# Patient Record
Sex: Female | Born: 1935 | Race: White | Hispanic: No | Smoking: Former smoker
Health system: Southern US, Community
[De-identification: ages and names within clinical notes are randomized; demographics above are authoritative.]

## PROBLEM LIST (undated history)

## (undated) DIAGNOSIS — M8580 Other specified disorders of bone density and structure, unspecified site: Secondary | ICD-10-CM

## (undated) DIAGNOSIS — D649 Anemia, unspecified: Secondary | ICD-10-CM

## (undated) DIAGNOSIS — G5602 Carpal tunnel syndrome, left upper limb: Secondary | ICD-10-CM

## (undated) DIAGNOSIS — C801 Malignant (primary) neoplasm, unspecified: Secondary | ICD-10-CM

## (undated) DIAGNOSIS — I10 Essential (primary) hypertension: Secondary | ICD-10-CM

## (undated) DIAGNOSIS — M1990 Unspecified osteoarthritis, unspecified site: Secondary | ICD-10-CM

## (undated) HISTORY — PX: CARPAL TUNNEL RELEASE: SHX101

## (undated) HISTORY — PX: VAGINAL HYSTERECTOMY: SUR661

## (undated) HISTORY — PX: CATARACT EXTRACTION W/ INTRAOCULAR LENS  IMPLANT, BILATERAL: SHX1307

## (undated) HISTORY — PX: KNEE SURGERY: SHX244

## (undated) HISTORY — PX: RETINAL DETACHMENT SURGERY: SHX105

---

## 1965-09-20 HISTORY — PX: THYROIDECTOMY: SHX17

## 1998-07-17 ENCOUNTER — Ambulatory Visit (HOSPITAL_COMMUNITY): Admission: RE | Admit: 1998-07-17 | Discharge: 1998-07-17 | Payer: Self-pay | Admitting: Obstetrics & Gynecology

## 2003-06-06 ENCOUNTER — Encounter (INDEPENDENT_AMBULATORY_CARE_PROVIDER_SITE_OTHER): Payer: Self-pay | Admitting: Specialist

## 2003-06-06 ENCOUNTER — Ambulatory Visit (HOSPITAL_COMMUNITY): Admission: RE | Admit: 2003-06-06 | Discharge: 2003-06-06 | Payer: Self-pay | Admitting: Gastroenterology

## 2007-09-26 ENCOUNTER — Encounter (HOSPITAL_COMMUNITY): Payer: Self-pay | Admitting: Obstetrics and Gynecology

## 2007-09-26 ENCOUNTER — Ambulatory Visit (HOSPITAL_COMMUNITY): Admission: RE | Admit: 2007-09-26 | Discharge: 2007-09-26 | Payer: Self-pay | Admitting: Obstetrics and Gynecology

## 2008-07-11 ENCOUNTER — Ambulatory Visit (HOSPITAL_BASED_OUTPATIENT_CLINIC_OR_DEPARTMENT_OTHER): Admission: RE | Admit: 2008-07-11 | Discharge: 2008-07-11 | Payer: Self-pay | Admitting: Orthopedic Surgery

## 2011-02-02 NOTE — Op Note (Signed)
Colleen Mclaughlin, Colleen Mclaughlin               ACCOUNT NO.:  [id number]   MEDICAL RECORD NO.:  [id number]          PATIENT TYPE:  AMB   LOCATION:  DFTL                          FACILITY:  [facility]   PHYSICIAN:  Zelphia Cairo, MD    DATE OF BIRTH:  06-08-1933   DATE OF PROCEDURE:  09/26/2007  DATE OF DISCHARGE:                               OPERATIVE REPORT   PREOPERATIVE DIAGNOSIS:  Postmenopausal bleeding.   POSTOPERATIVE DIAGNOSIS:  Postmenopausal bleeding, path pending.   PROCEDURE:  Hysteroscopy D&C.   SURGEON:  Zelphia Cairo, MD.   ANESTHESIA:  General.   COMPLICATIONS:  None.   CONDITION:  Stable and extubated to recovery room.   SPECIMEN:  Endometrial curettings to pathology.   PROCEDURE:  The patient was taken to the operating room where anesthesia  was found to be adequate.  She was placed in the dorsal lithotomy  position using Allen stirrups.  She was prepped and draped in sterile  fashion and a catheter was used to drain her bladder sterilely.  Bivalve  speculum was placed in the vagina and a single-tooth tenaculum on the  anterior lip of the cervix and the cervix was then serially dilated and  the diagnostic hysteroscope was inserted.  The endometrial cavity was  observed.  Due to pressure settings on our scope, it was difficult to  get adequate uterine distention, however, the uterine cavity that was  visualized appeared atrophic without mass lesions.  The hysteroscope was  then removed and a curette was used to perform a gentle curetting.   Specimen was passed off and sent to pathology.  The patient tolerated  the procedure well.  Sponge, lap, needle and instrument counts were  correct x2.      Zelphia Cairo, MD  Electronically Signed     GA/MEDQ  D:  09/27/2007  T:  09/27/2007  Job:  [id number]

## 2011-02-02 NOTE — Op Note (Signed)
NAMESURI, TAFOLLA               ACCOUNT NO.:  [id number]   MEDICAL RECORD NO.:  [id number]          PATIENT TYPE:  AMB   LOCATION:  DSC                          FACILITY:  [facility]   PHYSICIAN:  Katy Fitch. Sypher, M.D. DATE OF BIRTH:  1932-12-06   DATE OF PROCEDURE:  07/11/2008  DATE OF DISCHARGE:                               OPERATIVE REPORT   PREOPERATIVE DIAGNOSIS:  Severe entrapped neuropathy, right median nerve  at carpal tunnel with background diabetes.   POSTOPERATIVE DIAGNOSIS:  Severe entrapped neuropathy, right median  nerve at carpal tunnel with background diabetes.   OPERATIONS:  Release of right transcarpal ligament.   OPERATIONS:  Katy Fitch. Sypher, MD   ASSISTANT:  Marveen Reeks Dasnoit, PA   ANESTHESIA:  General by LMA.   SUPERVISING ANESTHESIOLOGIST:  Janetta Hora. Gelene Mink, MD   INDICATIONS:  Colleen Mclaughlin is a 77 year old woman who referred  through the courtesy of Dr. Dossie Arbour of [facility]  for evaluation of bilateral hand pain and numbness.  She is also  referred by her daughter who is a patient with our practice who has had  bilateral carpal tunnel syndrome.   Ms. Sappington was noted on clinical examination to have mild thenar atrophy,  numbness in the median distribution, and electrodiagnostic studies  revealing severe bilateral carpal tunnel syndrome, left slightly worsen  than right.  We recommended proceeding with release of her right  transcarpal ligament at this time.  She was advised preoperatively that  it would take between 4 and 5 months to see the full benefit of surgery.   At age 77 with diabetes, we cannot guarantee a full recovery of her  median nerve function.   After informed consent, she is brought to the operating room at this  time.   PROCEDURE:  Avilene Marrin was brought to the operating room and  placed in a supine position on the operating table.   Following the induction of general anesthesia by LMA  technique, the  right arm was prepped with Betadine soap solution and sterilely draped.  A pneumatic tourniquet was applied to the proximal right brachium.   Following exsanguination of the right arm with an Esmarch bandage, an  arterial tourniquet was inflated to 220 mmHg.  The procedure commenced  with a short incision in the line of the ring finger in the palm.  Subcutaneous tissues were carefully divided revealing the palmar fascia.   This was split longitudinally to reveal a common sensory branch of the  median nerve and superficial palmar arch.   The common sensory branches were followed back to the median nerve  proper, which was gently isolated from the transcarpal ligament deep  surface.  Ligament was then released along its ulnar border extending  into the distal forearm.   This widely opened carpal canal.   No mass or other predicaments were noted.   Bleeding points along the margin of the released ligament were  electrocauterized with bipolar current followed by repair of the skin  with intradermal 2-0 Prolene suture.   A compressive dressing was applied with a  volar plaster splint  maintaining the wrist in 5 degrees of dorsiflexion.  For aftercare, Ms.  Guedes was provided a prescription for Vicodin 5 mg 1 p.o. q.4-6 h.  p.r.n. pain 20 tablets without refill.      Katy Fitch Sypher, M.D.  Electronically Signed     RVS/MEDQ  D:  07/11/2008  T:  07/11/2008  Job:  [id number]   cc:   Barry Dienes. Eloise Harman, M.D.

## 2011-02-05 NOTE — Op Note (Signed)
   NAME:  Colleen Mclaughlin, Colleen Mclaughlin                         ACCOUNT NO.:  [id number]   MEDICAL RECORD NO.:  [id number]                   PATIENT TYPE:  AMB   LOCATION:  ENDO                                 FACILITY:  [facility]   PHYSICIAN:  John C. Madilyn Fireman, M.D.                 DATE OF BIRTH:  [birth year]   DATE OF PROCEDURE:  06/06/2003  DATE OF DISCHARGE:                                 OPERATIVE REPORT   PROCEDURE:  Colonoscopy.   INDICATION FOR PROCEDURE:  Colon cancer screening in a 77 year old patient  with no previous screening.   DESCRIPTION OF PROCEDURE:  The patient was placed in the left lateral  decubitus position and placed on the pulse monitor with continuous low-flow  oxygen delivered by nasal cannula.  She was sedated with 100 mcg IV fentanyl  and 8 mg IV Versed.  The Olympus video colonoscope was inserted into the  rectum and advanced to the cecum, confirmed by transillumination at  McBurney's point and visualization of the ileocecal valve and appendiceal  orifice.  The prep was excellent.  The cecum and ascending colon appeared  normal with no masses, polyps, diverticula, or other mucosal abnormalities.  Within the transverse colon, there was seen a 1.2 cm pedunculated polyp  which was removed by snare.  The remainder of the transverse, descending,  sigmoid, and rectum appeared normal with no further polyps, masses,  diverticula, or other mucosal abnormalities.  The scope was then withdrawn  and the patient returned to the recovery room in stable condition.  She  tolerated the procedure well, and there were no immediate complications.   IMPRESSION:  1. Transverse colon polyp.  2. Otherwise, normal study.   PLAN:  Await histology to determine method and interval for future colon  screening.                                               John C. Madilyn Fireman, M.D.    JCH/MEDQ  D:  06/06/2003  T:  06/06/2003  Job:  [id number]   cc:   Barry Dienes. Eloise Harman, M.D.  [address]  [place]  [place] [address]  Fax: [phone]

## 2011-06-10 LAB — CBC
Hemoglobin: 16.3 — ABNORMAL HIGH
MCHC: 34.7
RBC: 4.92

## 2011-06-10 LAB — BASIC METABOLIC PANEL WITH GFR
BUN: 18
CO2: 23
Calcium: 8.9
Chloride: 102
Creatinine, Ser: 0.88
GFR calc non Af Amer: >60
Glucose, Bld: 81
Potassium: 3.6
Sodium: 136

## 2011-06-10 LAB — TYPE AND SCREEN
ABO/RH(D): O POS
Antibody Screen: NEGATIVE

## 2011-06-10 LAB — ABO/RH: ABO/RH(D): O POS

## 2011-06-22 LAB — BASIC METABOLIC PANEL
CO2: 25
Chloride: 108
GFR calc Af Amer: >60
Potassium: 4.1
Sodium: 141

## 2011-07-30 ENCOUNTER — Other Ambulatory Visit: Payer: Self-pay | Admitting: Neurology

## 2011-07-30 DIAGNOSIS — M79609 Pain in unspecified limb: Secondary | ICD-10-CM

## 2011-07-30 DIAGNOSIS — R269 Unspecified abnormalities of gait and mobility: Secondary | ICD-10-CM

## 2011-08-09 ENCOUNTER — Ambulatory Visit
Admission: RE | Admit: 2011-08-09 | Discharge: 2011-08-09 | Disposition: A | Discharge status: Home / Self Care | Payer: Medicare Other | Source: Ambulatory Visit | Attending: Neurology | Admitting: Neurology

## 2011-08-09 DIAGNOSIS — R269 Unspecified abnormalities of gait and mobility: Secondary | ICD-10-CM

## 2011-08-09 DIAGNOSIS — M79609 Pain in unspecified limb: Secondary | ICD-10-CM

## 2014-06-26 ENCOUNTER — Other Ambulatory Visit: Payer: Self-pay | Admitting: Obstetrics and Gynecology

## 2014-06-27 LAB — CYTOLOGY - PAP

## 2015-08-01 ENCOUNTER — Ambulatory Visit (HOSPITAL_COMMUNITY): Payer: Medicare Other

## 2015-08-01 ENCOUNTER — Other Ambulatory Visit (HOSPITAL_COMMUNITY): Payer: Self-pay | Admitting: Orthopaedic Surgery

## 2015-08-01 DIAGNOSIS — R52 Pain, unspecified: Secondary | ICD-10-CM

## 2016-06-22 ENCOUNTER — Ambulatory Visit (INDEPENDENT_AMBULATORY_CARE_PROVIDER_SITE_OTHER): Payer: Medicare Other | Admitting: Orthopaedic Surgery

## 2016-06-22 ENCOUNTER — Encounter (INDEPENDENT_AMBULATORY_CARE_PROVIDER_SITE_OTHER): Payer: Self-pay

## 2016-06-22 DIAGNOSIS — M542 Cervicalgia: Secondary | ICD-10-CM

## 2016-06-22 DIAGNOSIS — M1711 Unilateral primary osteoarthritis, right knee: Secondary | ICD-10-CM | POA: Diagnosis not present

## 2018-01-19 ENCOUNTER — Other Ambulatory Visit: Payer: Self-pay | Admitting: Internal Medicine

## 2018-01-19 DIAGNOSIS — R221 Localized swelling, mass and lump, neck: Secondary | ICD-10-CM

## 2018-01-20 ENCOUNTER — Ambulatory Visit
Admission: RE | Admit: 2018-01-20 | Discharge: 2018-01-20 | Disposition: A | Discharge status: Home / Self Care | Payer: Medicare Other | Source: Ambulatory Visit | Attending: Internal Medicine | Admitting: Internal Medicine

## 2018-01-20 DIAGNOSIS — R221 Localized swelling, mass and lump, neck: Secondary | ICD-10-CM

## 2018-01-20 MED ORDER — IOPAMIDOL (ISOVUE-300) INJECTION 61%
75.0000 mL | Freq: Once | INTRAVENOUS | Status: AC | PRN
  Administered 2018-01-20: 75 mL via INTRAVENOUS

## 2018-07-20 ENCOUNTER — Other Ambulatory Visit: Payer: Self-pay | Admitting: Orthopedic Surgery

## 2018-08-21 ENCOUNTER — Other Ambulatory Visit: Payer: Self-pay

## 2018-08-21 ENCOUNTER — Encounter (HOSPITAL_BASED_OUTPATIENT_CLINIC_OR_DEPARTMENT_OTHER): Payer: Self-pay

## 2018-08-22 ENCOUNTER — Encounter (HOSPITAL_BASED_OUTPATIENT_CLINIC_OR_DEPARTMENT_OTHER)
Admission: RE | Admit: 2018-08-22 | Discharge: 2018-08-22 | Disposition: A | Discharge status: Home / Self Care | Payer: Medicare Other | Source: Ambulatory Visit | Attending: Orthopedic Surgery | Admitting: Orthopedic Surgery

## 2018-08-22 DIAGNOSIS — I10 Essential (primary) hypertension: Secondary | ICD-10-CM | POA: Diagnosis present

## 2018-08-22 DIAGNOSIS — I499 Cardiac arrhythmia, unspecified: Secondary | ICD-10-CM | POA: Diagnosis not present

## 2018-08-22 DIAGNOSIS — I452 Bifascicular block: Secondary | ICD-10-CM | POA: Diagnosis not present

## 2018-08-22 NOTE — Progress Notes (Signed)
EKG reviewed by Dr. Miller, will proceed with surgery as scheduled.  

## 2018-08-29 ENCOUNTER — Encounter (HOSPITAL_BASED_OUTPATIENT_CLINIC_OR_DEPARTMENT_OTHER): Payer: Self-pay

## 2018-08-29 ENCOUNTER — Other Ambulatory Visit: Payer: Self-pay

## 2018-08-29 ENCOUNTER — Ambulatory Visit (HOSPITAL_BASED_OUTPATIENT_CLINIC_OR_DEPARTMENT_OTHER): Payer: Medicare Other | Admitting: Anesthesiology

## 2018-08-29 ENCOUNTER — Ambulatory Visit (HOSPITAL_BASED_OUTPATIENT_CLINIC_OR_DEPARTMENT_OTHER)
Admission: RE | Admit: 2018-08-29 | Discharge: 2018-08-29 | Disposition: A | Discharge status: Home / Self Care | Payer: Medicare Other | Source: Ambulatory Visit | Attending: Orthopedic Surgery | Admitting: Orthopedic Surgery

## 2018-08-29 ENCOUNTER — Encounter (HOSPITAL_BASED_OUTPATIENT_CLINIC_OR_DEPARTMENT_OTHER)
Admission: RE | Disposition: A | Discharge status: Home / Self Care | Payer: Self-pay | Source: Ambulatory Visit | Attending: Orthopedic Surgery

## 2018-08-29 DIAGNOSIS — G5602 Carpal tunnel syndrome, left upper limb: Secondary | ICD-10-CM | POA: Insufficient documentation

## 2018-08-29 DIAGNOSIS — Z87891 Personal history of nicotine dependence: Secondary | ICD-10-CM | POA: Diagnosis not present

## 2018-08-29 DIAGNOSIS — E890 Postprocedural hypothyroidism: Secondary | ICD-10-CM | POA: Diagnosis not present

## 2018-08-29 DIAGNOSIS — I10 Essential (primary) hypertension: Secondary | ICD-10-CM | POA: Diagnosis not present

## 2018-08-29 HISTORY — DX: Anemia, unspecified: D64.9

## 2018-08-29 HISTORY — DX: Other specified disorders of bone density and structure, unspecified site: M85.80

## 2018-08-29 HISTORY — DX: Essential (primary) hypertension: I10

## 2018-08-29 HISTORY — DX: Unspecified osteoarthritis, unspecified site: M19.90

## 2018-08-29 HISTORY — DX: Malignant (primary) neoplasm, unspecified: C80.1

## 2018-08-29 HISTORY — PX: CARPAL TUNNEL RELEASE: SHX101

## 2018-08-29 HISTORY — DX: Carpal tunnel syndrome, left upper limb: G56.02

## 2018-08-29 SURGERY — CARPAL TUNNEL RELEASE
Anesthesia: Monitor Anesthesia Care | Site: Wrist | Laterality: Left

## 2018-08-29 MED ORDER — MIDAZOLAM HCL 2 MG/2ML IJ SOLN: 1.0000 mg | INTRAMUSCULAR | Status: DC | PRN

## 2018-08-29 MED ORDER — SCOPOLAMINE 1 MG/3DAYS TD PT72: 1.0000 | MEDICATED_PATCH | Freq: Once | TRANSDERMAL | Status: DC | PRN

## 2018-08-29 MED ORDER — FENTANYL CITRATE (PF) 100 MCG/2ML IJ SOLN
INTRAMUSCULAR | Status: AC
  Filled 2018-08-29: qty 2

## 2018-08-29 MED ORDER — TRAMADOL HCL 50 MG PO TABS: 50.0000 mg | ORAL_TABLET | Freq: Four times a day (QID) | ORAL | 0 refills | Status: DC | PRN

## 2018-08-29 MED ORDER — BUPIVACAINE HCL (PF) 0.25 % IJ SOLN
INTRAMUSCULAR | Status: DC | PRN
  Administered 2018-08-29: 9 mL

## 2018-08-29 MED ORDER — FENTANYL CITRATE (PF) 100 MCG/2ML IJ SOLN
50.0000 ug | INTRAMUSCULAR | Status: DC | PRN
  Administered 2018-08-29: 50 ug via INTRAVENOUS

## 2018-08-29 MED ORDER — CHLORHEXIDINE GLUCONATE 4 % EX LIQD: 60.0000 mL | Freq: Once | CUTANEOUS | Status: DC

## 2018-08-29 MED ORDER — PROPOFOL 500 MG/50ML IV EMUL
INTRAVENOUS | Status: DC | PRN
  Administered 2018-08-29: 50 ug/kg/min via INTRAVENOUS

## 2018-08-29 MED ORDER — LIDOCAINE HCL (PF) 0.5 % IJ SOLN
INTRAMUSCULAR | Status: DC | PRN
  Administered 2018-08-29: 30 mL via INTRAVENOUS

## 2018-08-29 MED ORDER — CEFAZOLIN SODIUM-DEXTROSE 2-4 GM/100ML-% IV SOLN
2.0000 g | INTRAVENOUS | Status: AC
  Administered 2018-08-29: 2 g via INTRAVENOUS

## 2018-08-29 MED ORDER — CEFAZOLIN SODIUM-DEXTROSE 2-4 GM/100ML-% IV SOLN
INTRAVENOUS | Status: AC
  Filled 2018-08-29: qty 100

## 2018-08-29 MED ORDER — FENTANYL CITRATE (PF) 100 MCG/2ML IJ SOLN: 25.0000 ug | INTRAMUSCULAR | Status: DC | PRN

## 2018-08-29 MED ORDER — LACTATED RINGERS IV SOLN
INTRAVENOUS | Status: DC
  Administered 2018-08-29: 11:00:00 via INTRAVENOUS

## 2018-08-29 SURGICAL SUPPLY — 37 items
BLADE SURG 15 STRL LF DISP TIS (BLADE) ×1 IMPLANT
BLADE SURG 15 STRL SS (BLADE) ×2
BNDG COHESIVE 3X5 TAN STRL LF (GAUZE/BANDAGES/DRESSINGS) ×3 IMPLANT
BNDG ESMARK 4X9 LF (GAUZE/BANDAGES/DRESSINGS) IMPLANT
BNDG GAUZE ELAST 4 BULKY (GAUZE/BANDAGES/DRESSINGS) ×3 IMPLANT
CHLORAPREP W/TINT 26ML (MISCELLANEOUS) ×3 IMPLANT
CORD BIPOLAR FORCEPS 12FT (ELECTRODE) ×3 IMPLANT
COVER BACK TABLE 60X90IN (DRAPES) ×3 IMPLANT
COVER MAYO STAND STRL (DRAPES) ×3 IMPLANT
COVER WAND RF STERILE (DRAPES) IMPLANT
CUFF TOURNIQUET SINGLE 18IN (TOURNIQUET CUFF) ×3 IMPLANT
DRAPE EXTREMITY T 121X128X90 (DRAPE) ×3 IMPLANT
DRAPE SURG 17X23 STRL (DRAPES) ×3 IMPLANT
DRSG PAD ABDOMINAL 8X10 ST (GAUZE/BANDAGES/DRESSINGS) ×3 IMPLANT
GAUZE SPONGE 4X4 12PLY STRL (GAUZE/BANDAGES/DRESSINGS) ×3 IMPLANT
GAUZE XEROFORM 1X8 LF (GAUZE/BANDAGES/DRESSINGS) ×3 IMPLANT
GLOVE BIOGEL PI IND STRL 7.0 (GLOVE) ×1 IMPLANT
GLOVE BIOGEL PI IND STRL 8 (GLOVE) ×1 IMPLANT
GLOVE BIOGEL PI IND STRL 8.5 (GLOVE) ×1 IMPLANT
GLOVE BIOGEL PI INDICATOR 7.0 (GLOVE) ×2
GLOVE BIOGEL PI INDICATOR 8 (GLOVE) ×2
GLOVE BIOGEL PI INDICATOR 8.5 (GLOVE) ×2
GLOVE SURG ORTHO 8.0 STRL STRW (GLOVE) ×3 IMPLANT
GLOVE SURG SYN 8.0 (GLOVE) ×3 IMPLANT
GOWN STRL REUS W/ TWL LRG LVL3 (GOWN DISPOSABLE) ×1 IMPLANT
GOWN STRL REUS W/TWL LRG LVL3 (GOWN DISPOSABLE) ×2
GOWN STRL REUS W/TWL XL LVL3 (GOWN DISPOSABLE) ×3 IMPLANT
NEEDLE PRECISIONGLIDE 27X1.5 (NEEDLE) IMPLANT
NS IRRIG 1000ML POUR BTL (IV SOLUTION) ×3 IMPLANT
PACK BASIN DAY SURGERY FS (CUSTOM PROCEDURE TRAY) ×3 IMPLANT
STOCKINETTE 4X48 STRL (DRAPES) ×3 IMPLANT
SUT ETHILON 4 0 PS 2 18 (SUTURE) ×3 IMPLANT
SUT VICRYL 4-0 PS2 18IN ABS (SUTURE) IMPLANT
SYR BULB 3OZ (MISCELLANEOUS) ×3 IMPLANT
SYR CONTROL 10ML LL (SYRINGE) IMPLANT
TOWEL GREEN STERILE FF (TOWEL DISPOSABLE) ×3 IMPLANT
UNDERPAD 30X30 (UNDERPADS AND DIAPERS) ×3 IMPLANT

## 2018-08-29 NOTE — Discharge Instructions (Addendum)

## 2018-08-29 NOTE — Transfer of Care (Signed)
Immediate Anesthesia Transfer of Care Note  Patient: Colleen Mclaughlin  Procedure(s) Performed: LEFT CARPAL TUNNEL RELEASE (Left Wrist)  Patient Location: PACU  Anesthesia Type:MAC and Bier block  Level of Consciousness: awake, alert  and oriented  Airway & Oxygen Therapy: Patient Spontanous Breathing  Post-op Assessment: Report given to RN and Post -op Vital signs reviewed and stable  Post vital signs: Reviewed and stable  Last Vitals:  Vitals Value Taken Time  BP    Temp    Pulse 49 08/29/2018 11:13 AM  Resp 14 08/29/2018 11:13 AM  SpO2 97 % 08/29/2018 11:13 AM  Vitals shown include unvalidated device data.  Last Pain:  Vitals:   08/29/18 1023  TempSrc: Oral  PainSc: 0-No pain         Complications: No apparent anesthesia complications

## 2018-08-29 NOTE — Op Note (Signed)
NAME: Colleen Mclaughlin MEDICAL RECORD NO: [id number] DATE OF BIRTH: 30-Nov-1932 [facility] LOCATION: [facility] PHYSICIAN: Wynonia Sours, MD   OPERATIVE REPORT   DATE OF PROCEDURE: 08/29/18    PREOPERATIVE DIAGNOSIS:   Carpal tunnel syndrome left hand   POSTOPERATIVE DIAGNOSIS:   Same   PROCEDURE:   Decompression median nerve left hand   SURGEON: Daryll Brod, M.D.   ASSISTANT: none   ANESTHESIA:  Bier block with sedation and Local   INTRAVENOUS FLUIDS:  Per anesthesia flow sheet.   ESTIMATED BLOOD LOSS:  Minimal.   COMPLICATIONS:  None.   SPECIMENS:  none   TOURNIQUET TIME:    Total Tourniquet Time Documented: Forearm (Left) - 16 minutes Total: Forearm (Left) - 16 minutes    DISPOSITION:  Stable to PACU.   INDICATIONS: Patient is an 84 year old female with a history of numbness and tingling of her hands.  She has had nerve conductions performed revealing no response to the median nerve sensory component.  She is desirous of proceeding to have this released in an effort to regain some sensibility.  Pre-peri-and postoperative course been discussed along with risk complications.  She is aware that there is no guarantee to the surgery the possibility of infection recurrence injury to arteries nerves tendons complete relief of symptoms distally possibility that this is only to halt the process and prevent it from getting worse.  Preoperative area the patient is seen the extremity marked by both patient and surgeon antibiotic given  OPERATIVE COURSE: Patient is brought to the operating room where a forearm-based IV regional anesthetic was carried out without difficulty.  This was after being placed in a supine position.  A prep was done with ChloraPrep and a three-minute dry time was allowed.  Was taken to confirm patient procedure.  A longitudinal incision was made left palm carried down through subcutaneous tissue.  Bleeders were electrocauterized with  bipolar.  The palmar fascia was split.  The superficial palmar arch was identified along with the flexor tendon the ring little finger.  Retractors were placed retracting median nerve radially ulnar nerve ulnarly the flexor retinaculum was then incised with sharp dissection and its ulnar border.  A right angle and saw retractor placed tween skin and forearm fascia the fascia was then released for approximately 3 2 to 3 cm proximal to the wrist crease under direct vision.  Persistent median artery was present.  The area compression to the nerve was apparent.  Motor branch entered the muscle distally.  Wound was copiously irrigated with saline.  Skin was closed interrupted 4-0 nylon sutures.  Local infiltration quarter percent bupivacaine without epinephrine was given approximately 8 cc was used.  Sterile compressive dressing with the fingers free was applied.  On flyer deflation of the tourniquet all fingers immediately pink.  She was taken to the recovery room for observation in satisfactory condition.  She will be discharged home to return the hand center of [facility] to take Tylenol with ibuprofen and has tramadol for breakthrough.   Daryll Brod, MD Electronically signed, 08/29/18

## 2018-08-29 NOTE — H&P (Signed)
Colleen Mclaughlin is an 84 y.o. female.   Chief Complaint: numbness left hand [name] is an 84 year old right-hand-dominant female referred by Dr. Sharlett Iles for consultation regarding numbness tingling pain in her left hand. This thumb through middle fingers. This been going on for years. She thought she might have a carpal tunnel release a year ago but her husband died. She has had a carpal tunnel release done in the past by Dr. Harley Hallmark many years ago. She had nerve conductions prior to that time. She states she is not dropping things but has a sharp pain with a VAS score 10/10. She has no history of injury to the hand or to the neck. She has not had any treatment nor tried anything for this. She is not awakened at night. She has a history of thyroid problems arthritis no history of diabetes or gout. Family  history is positive diabetes negative for thyroid problems arthritis  She has had nerve conductions done revealing a motor delay of 6.8 on her left side with no sensory response.   Past Medical History:  Diagnosis Date  . Anemia    x2 years ago took med cleared  . Arthritis    general  . Cancer ([place])    endometrial / complete hyst 2009  . Carpal tunnel syndrome on left   . Hypertension   . Osteopenia     Past Surgical History:  Procedure Laterality Date  . CARPAL TUNNEL RELEASE Right    2012  . CATARACT EXTRACTION W/ INTRAOCULAR LENS  IMPLANT, BILATERAL     one approx 5 years ago and the other greater than that but unsure when  . KNEE SURGERY Right    approx 25 years ago "chipped bone"  . RETINAL DETACHMENT SURGERY    . THYROIDECTOMY  1967  . VAGINAL HYSTERECTOMY     2009    History reviewed. No pertinent family history. Social History:  reports that she quit smoking about 23 years ago. She has never used smokeless tobacco. She reports that she does not drink alcohol or use drugs.  Allergies:  Allergies  Allergen Reactions  . Sulfa Antibiotics Anaphylaxis    Hives    . Codeine Other (See Comments)    Severe headaches     No medications prior to admission.    No results found for this or any previous visit (from the past 48 hour(s)).  No results found.   Pertinent items are noted in HPI.  Height 5\' 4"  (1.626 m).  General appearance: alert, cooperative and appears stated age Head: Normocephalic, without obvious abnormality Neck: no JVD Resp: clear to auscultation bilaterally Cardio: regular rate and rhythm, S1, S2 normal, no murmur, click, rub or gallop GI: soft, non-tender; bowel sounds normal; no masses,  no organomegaly Extremities: numbness left hand Pulses: 2+ and symmetric Skin: Skin color, texture, turgor normal. No rashes or lesions Neurologic: Grossly normal Incision/Wound: na  Assessment/Plan Assessment:  1. Carpal tunnel syndrome of left wrist    Plan: Have discussed the nerve conductions with her. She would like to go ahead and have this surgically released. Pre-peri-postoperative course are discussed along with risk complications. She is aware there is no guarantee to the surgery the possibility of infection recurrence injury to arteries nerves tendons complete relief symptoms dystrophy. I do not feel she needs a campus transfer at this time and that she is able to oppose her thumb despite the atrophy of the thenar intrinsics. They are aware that we are attempting to  halt the process to get the nerve a chance to get better but we cannot guarantee that she is scheduled for left carpal tunnel release in outpatient under regional anesthesia   Daryll Brod 08/29/2018, 9:01 AM

## 2018-08-29 NOTE — Brief Op Note (Signed)
08/29/2018  11:06 AM  PATIENT:  Colleen Mclaughlin  84 y.o. female  PRE-OPERATIVE DIAGNOSIS:  LEFT CARPAL TUNNEL SYNDROME  POST-OPERATIVE DIAGNOSIS:  LEFT CARPAL TUNNEL SYNDROME  PROCEDURE:  Procedure(s): LEFT CARPAL TUNNEL RELEASE (Left)  SURGEON:  Surgeon(s) and Role:    * Daryll Brod, MD - Primary  PHYSICIAN ASSISTANT:   ASSISTANTS: none   ANESTHESIA:   local, regional and IV sedation  EBL:  75ml   BLOOD ADMINISTERED:none  DRAINS: none   LOCAL MEDICATIONS USED:  BUPIVICAINE   SPECIMEN:  No Specimen  DISPOSITION OF SPECIMEN:  N/A  COUNTS:  YES  TOURNIQUET:   Total Tourniquet Time Documented: Forearm (Left) - 16 minutes Total: Forearm (Left) - 16 minutes   DICTATION: .Viviann Spare Dictation  PLAN OF CARE: Discharge to home after PACU  PATIENT DISPOSITION:  PACU - hemodynamically stable.

## 2018-08-29 NOTE — Anesthesia Preprocedure Evaluation (Signed)
Anesthesia Evaluation  Patient identified by MRN, date of birth, ID band Patient awake    Reviewed: Allergy & Precautions, NPO status , Patient's Chart, lab work & pertinent test results  Airway Mallampati: II  TM Distance: >3 FB Neck ROM: Full    Dental   Pulmonary former smoker,    breath sounds clear to auscultation       Cardiovascular hypertension, Pt. on medications and Pt. on home beta blockers  Rhythm:Regular Rate:Normal     Neuro/Psych  Neuromuscular disease    GI/Hepatic negative GI ROS, Neg liver ROS,   Endo/Other  Hypothyroidism   Renal/GU negative Renal ROS     Musculoskeletal  (+) Arthritis ,   Abdominal   Peds  Hematology negative hematology ROS (+)   Anesthesia Other Findings   Reproductive/Obstetrics                             Anesthesia Physical Anesthesia Plan  ASA: III  Anesthesia Plan: MAC and Bier Block and Bier Block-LIDOCAINE ONLY   Post-op Pain Management:    Induction: Intravenous  PONV Risk Score and Plan: 2 and Propofol infusion, Ondansetron and Treatment may vary due to age or medical condition  Airway Management Planned: Natural Airway and Simple Face Mask  Additional Equipment:   Intra-op Plan:   Post-operative Plan:   Informed Consent: I have reviewed the patients History and Physical, chart, labs and discussed the procedure including the risks, benefits and alternatives for the proposed anesthesia with the patient or authorized representative who has indicated his/her understanding and acceptance.     Plan Discussed with: CRNA  Anesthesia Plan Comments:         Anesthesia Quick Evaluation

## 2018-08-29 NOTE — Anesthesia Procedure Notes (Signed)
Anesthesia Regional Block: Bier block (IV Regional)   Pre-Anesthetic Checklist: ,, timeout performed, Correct Patient, Correct Site, Correct Laterality, Correct Procedure,, site marked, surgical consent,, at surgeon's request  Laterality: Left     Needles:  Injection technique: Single-shot  Needle Type: Other      Needle Gauge: 22     Additional Needles:   Procedures:,,,,, intact distal pulses, Esmarch exsanguination, single tourniquet utilized,  Narrative:   Performed by: [employer]

## 2018-08-29 NOTE — Anesthesia Postprocedure Evaluation (Signed)
Anesthesia Post Note  Patient: Colleen Mclaughlin  Procedure(s) Performed: LEFT CARPAL TUNNEL RELEASE (Left Wrist)     Patient location during evaluation: PACU Anesthesia Type: MAC and Bier Block Level of consciousness: awake and alert Pain management: pain level controlled Vital Signs Assessment: post-procedure vital signs reviewed and stable Respiratory status: spontaneous breathing, nonlabored ventilation, respiratory function stable and patient connected to nasal cannula oxygen Cardiovascular status: stable and blood pressure returned to baseline Postop Assessment: no apparent nausea or vomiting Anesthetic complications: no    Last Vitals:  Vitals:   08/29/18 1132 08/29/18 1141  BP: (!) 128/56   Pulse: (!) 47   Resp: 18 20  Temp: 37 C   SpO2: 97% 100%    Last Pain:  Vitals:   08/29/18 1023  TempSrc: Oral  PainSc: 0-No pain                 Tiajuana Amass

## 2018-08-30 ENCOUNTER — Encounter (HOSPITAL_BASED_OUTPATIENT_CLINIC_OR_DEPARTMENT_OTHER): Payer: Self-pay | Admitting: Orthopedic Surgery

## 2018-09-07 ENCOUNTER — Encounter (HOSPITAL_COMMUNITY): Payer: Self-pay

## 2018-09-07 ENCOUNTER — Emergency Department (HOSPITAL_COMMUNITY): Payer: Medicare Other

## 2018-09-07 ENCOUNTER — Other Ambulatory Visit: Payer: Self-pay

## 2018-09-07 ENCOUNTER — Inpatient Hospital Stay (HOSPITAL_COMMUNITY)
Admission: EM | Admit: 2018-09-07 | Discharge: 2018-09-10 | DRG: 470 | Disposition: A | Discharge status: Home Health | Payer: Medicare Other | Attending: Internal Medicine | Admitting: Internal Medicine

## 2018-09-07 DIAGNOSIS — Z7982 Long term (current) use of aspirin: Secondary | ICD-10-CM

## 2018-09-07 DIAGNOSIS — S72002A Fracture of unspecified part of neck of left femur, initial encounter for closed fracture: Principal | ICD-10-CM | POA: Diagnosis present

## 2018-09-07 DIAGNOSIS — Z8542 Personal history of malignant neoplasm of other parts of uterus: Secondary | ICD-10-CM | POA: Diagnosis not present

## 2018-09-07 DIAGNOSIS — Y92018 Other place in single-family (private) house as the place of occurrence of the external cause: Secondary | ICD-10-CM | POA: Diagnosis not present

## 2018-09-07 DIAGNOSIS — M810 Age-related osteoporosis without current pathological fracture: Secondary | ICD-10-CM | POA: Diagnosis present

## 2018-09-07 DIAGNOSIS — Z87891 Personal history of nicotine dependence: Secondary | ICD-10-CM

## 2018-09-07 DIAGNOSIS — Z79899 Other long term (current) drug therapy: Secondary | ICD-10-CM

## 2018-09-07 DIAGNOSIS — M25552 Pain in left hip: Secondary | ICD-10-CM | POA: Diagnosis present

## 2018-09-07 DIAGNOSIS — W19XXXA Unspecified fall, initial encounter: Secondary | ICD-10-CM

## 2018-09-07 DIAGNOSIS — R011 Cardiac murmur, unspecified: Secondary | ICD-10-CM | POA: Diagnosis present

## 2018-09-07 DIAGNOSIS — E890 Postprocedural hypothyroidism: Secondary | ICD-10-CM | POA: Diagnosis present

## 2018-09-07 DIAGNOSIS — W010XXA Fall on same level from slipping, tripping and stumbling without subsequent striking against object, initial encounter: Secondary | ICD-10-CM | POA: Diagnosis present

## 2018-09-07 DIAGNOSIS — Z01810 Encounter for preprocedural cardiovascular examination: Secondary | ICD-10-CM | POA: Diagnosis not present

## 2018-09-07 DIAGNOSIS — Z90710 Acquired absence of both cervix and uterus: Secondary | ICD-10-CM | POA: Diagnosis not present

## 2018-09-07 DIAGNOSIS — Z79890 Hormone replacement therapy (postmenopausal): Secondary | ICD-10-CM | POA: Diagnosis not present

## 2018-09-07 DIAGNOSIS — Y92009 Unspecified place in unspecified non-institutional (private) residence as the place of occurrence of the external cause: Secondary | ICD-10-CM

## 2018-09-07 DIAGNOSIS — Z96642 Presence of left artificial hip joint: Secondary | ICD-10-CM

## 2018-09-07 DIAGNOSIS — M25559 Pain in unspecified hip: Secondary | ICD-10-CM

## 2018-09-07 DIAGNOSIS — I10 Essential (primary) hypertension: Secondary | ICD-10-CM | POA: Diagnosis present

## 2018-09-07 DIAGNOSIS — S72042A Displaced fracture of base of neck of left femur, initial encounter for closed fracture: Secondary | ICD-10-CM | POA: Diagnosis not present

## 2018-09-07 LAB — BASIC METABOLIC PANEL
ANION GAP: 13 (ref 5–15)
BUN: 23 mg/dL (ref 8–23)
CO2: 23 mmol/L (ref 22–32)
Calcium: 9 mg/dL (ref 8.9–10.3)
Chloride: 104 mmol/L (ref 98–111)
Creatinine, Ser: 1.02 mg/dL — ABNORMAL HIGH (ref 0.44–1.00)
GFR calc non Af Amer: 51 mL/min — ABNORMAL LOW (ref >60)
GFR, EST AFRICAN AMERICAN: 59 mL/min — AB (ref >60)
Glucose, Bld: 121 mg/dL — ABNORMAL HIGH (ref 70–99)
Potassium: 3.7 mmol/L (ref 3.5–5.1)
Sodium: 140 mmol/L (ref 135–145)

## 2018-09-07 LAB — CBC WITH DIFFERENTIAL/PLATELET
Abs Immature Granulocytes: 0.09 10*3/uL — ABNORMAL HIGH (ref 0.00–0.07)
Basophils Absolute: 0.1 10*3/uL (ref 0.0–0.1)
Basophils Relative: 1 %
Eosinophils Absolute: 0.3 10*3/uL (ref 0.0–0.5)
Eosinophils Relative: 3 %
HCT: 47.2 % — ABNORMAL HIGH (ref 36.0–46.0)
Hemoglobin: 15.5 g/dL — ABNORMAL HIGH (ref 12.0–15.0)
Immature Granulocytes: 1 %
LYMPHS ABS: 1.6 10*3/uL (ref 0.7–4.0)
Lymphocytes Relative: 13 %
MCH: 30.6 pg (ref 26.0–34.0)
MCHC: 32.8 g/dL (ref 30.0–36.0)
MCV: 93.3 fL (ref 80.0–100.0)
MONOS PCT: 6 %
Monocytes Absolute: 0.7 10*3/uL (ref 0.1–1.0)
NRBC: 0 % (ref 0.0–0.2)
Neutro Abs: 9.2 10*3/uL — ABNORMAL HIGH (ref 1.7–7.7)
Neutrophils Relative %: 76 %
Platelets: 267 10*3/uL (ref 150–400)
RBC: 5.06 MIL/uL (ref 3.87–5.11)
RDW: 13.2 % (ref 11.5–15.5)
WBC: 11.9 10*3/uL — ABNORMAL HIGH (ref 4.0–10.5)

## 2018-09-07 LAB — TYPE AND SCREEN
ABO/RH(D): O POS
Antibody Screen: NEGATIVE

## 2018-09-07 LAB — PROTIME-INR
INR: 0.9
Prothrombin Time: 12.1 seconds (ref 11.4–15.2)

## 2018-09-07 MED ORDER — DOCUSATE SODIUM 100 MG PO CAPS
100.0000 mg | ORAL_CAPSULE | Freq: Two times a day (BID) | ORAL | Status: DC
  Administered 2018-09-07 – 2018-09-10 (×6): 100 mg via ORAL
  Filled 2018-09-07 (×6): qty 1

## 2018-09-07 MED ORDER — ATENOLOL 50 MG PO TABS
50.0000 mg | ORAL_TABLET | Freq: Every day | ORAL | Status: DC
  Administered 2018-09-09 – 2018-09-10 (×2): 50 mg via ORAL
  Filled 2018-09-07 (×4): qty 1

## 2018-09-07 MED ORDER — HEPARIN SODIUM (PORCINE) 5000 UNIT/ML IJ SOLN
5000.0000 [IU] | Freq: Three times a day (TID) | INTRAMUSCULAR | Status: DC
  Administered 2018-09-07 – 2018-09-10 (×6): 5000 [IU] via SUBCUTANEOUS
  Filled 2018-09-07 (×7): qty 1

## 2018-09-07 MED ORDER — CALCIUM CARBONATE 1250 (500 CA) MG PO TABS
2.0000 | ORAL_TABLET | Freq: Every day | ORAL | Status: DC
  Administered 2018-09-08 – 2018-09-10 (×3): 1000 mg via ORAL
  Filled 2018-09-07 (×3): qty 1

## 2018-09-07 MED ORDER — HYDROMORPHONE HCL 1 MG/ML IJ SOLN
1.0000 mg | Freq: Once | INTRAMUSCULAR | Status: AC
  Administered 2018-09-07: 1 mg via INTRAVENOUS
  Filled 2018-09-07: qty 1

## 2018-09-07 MED ORDER — MORPHINE SULFATE (PF) 2 MG/ML IV SOLN: 0.5000 mg | INTRAVENOUS | Status: DC | PRN

## 2018-09-07 MED ORDER — TRIAMTERENE-HCTZ 37.5-25 MG PO TABS
0.5000 | ORAL_TABLET | Freq: Every day | ORAL | Status: DC
  Administered 2018-09-08 – 2018-09-10 (×3): 0.5 via ORAL
  Filled 2018-09-07: qty 0.5
  Filled 2018-09-07 (×2): qty 1

## 2018-09-07 MED ORDER — FENTANYL CITRATE (PF) 100 MCG/2ML IJ SOLN
75.0000 ug | Freq: Once | INTRAMUSCULAR | Status: AC
  Administered 2018-09-07: 75 ug via INTRAVENOUS
  Filled 2018-09-07: qty 2

## 2018-09-07 MED ORDER — LEVOTHYROXINE SODIUM 100 MCG PO TABS
200.0000 ug | ORAL_TABLET | Freq: Every day | ORAL | Status: DC
  Administered 2018-09-08 – 2018-09-09 (×2): 200 ug via ORAL
  Filled 2018-09-07 (×3): qty 2

## 2018-09-07 MED ORDER — TRAMADOL HCL 50 MG PO TABS
100.0000 mg | ORAL_TABLET | Freq: Four times a day (QID) | ORAL | Status: DC | PRN
  Administered 2018-09-08 – 2018-09-10 (×2): 100 mg via ORAL
  Filled 2018-09-07 (×2): qty 2

## 2018-09-07 MED ORDER — ADULT MULTIVITAMIN W/MINERALS CH
ORAL_TABLET | Freq: Every day | ORAL | Status: DC
  Administered 2018-09-08 – 2018-09-10 (×3): 1 via ORAL
  Filled 2018-09-07 (×3): qty 1

## 2018-09-07 MED ORDER — ONDANSETRON HCL 4 MG/2ML IJ SOLN
4.0000 mg | Freq: Once | INTRAMUSCULAR | Status: AC
  Administered 2018-09-07: 4 mg via INTRAVENOUS
  Filled 2018-09-07: qty 2

## 2018-09-07 MED ORDER — HYDROMORPHONE HCL 1 MG/ML IJ SOLN
0.5000 mg | INTRAMUSCULAR | Status: DC | PRN
  Administered 2018-09-08: 0.5 mg via INTRAVENOUS
  Filled 2018-09-07 (×2): qty 0.5

## 2018-09-07 NOTE — ED Notes (Signed)
Bed: WA02 Expected date:  Expected time:  Means of arrival:  Comments: EMS-fall-hip pain

## 2018-09-07 NOTE — ED Notes (Signed)
Patient transported to X-ray 

## 2018-09-07 NOTE — ED Notes (Signed)
ED TO INPATIENT HANDOFF REPORT  Name/Age/Gender Colleen Mclaughlin 84 y.o. female  Code Status Advance Directive Documentation     Most Recent Value  Type of Advance Directive  Healthcare Power of Attorney, Living will  Pre-existing out of facility DNR order (yellow form or pink MOST form)  -  "MOST" Form in Place?  -      Home/SNF/Other Home  Chief Complaint hip pain  Level of Care/Admitting Diagnosis ED Disposition    ED Disposition Condition [place]: [place] [100102]  Level of Care: Med-Surg [16]  Diagnosis: Fracture of femoral neck, left [facility]) [[id number]]  Admitting Physician: Sherron Monday  Attending Physician: Sherron Monday  Estimated length of stay: 3 - 4 days  Certification:: I certify this patient will need inpatient services for at least 2 midnights  PT Class (Do Not Modify): Inpatient [101]  PT Acc Code (Do Not Modify): Private [1]       Medical History Past Medical History:  Diagnosis Date  . Anemia    x2 years ago took med cleared  . Arthritis    general  . Cancer ([place])    endometrial / complete hyst 2009  . Carpal tunnel syndrome on left   . Hypertension   . Osteopenia     Allergies Allergies  Allergen Reactions  . Sulfa Antibiotics Anaphylaxis    Hives   . Codeine Other (See Comments)    Severe headaches     IV Location/Drains/Wounds Patient Lines/Drains/Airways Status   Active Line/Drains/Airways    Name:   Placement date:   Placement time:   Site:   Days:   Incision (Closed) 08/29/18 Hand Left   08/29/18    1053     9          Labs/Imaging Results for orders placed or performed during the hospital encounter of 09/07/18 (from the past 48 hour(s))  Basic metabolic panel     Status: Abnormal   Collection Time: 09/07/18  3:42 PM  Result Value Ref Range   Sodium 140 135 - 145 mmol/L   Potassium 3.7 3.5 - 5.1 mmol/L   Chloride 104 98 - 111 mmol/L   CO2 23  22 - 32 mmol/L   Glucose, Bld 121 (H) 70 - 99 mg/dL   BUN 23 8 - 23 mg/dL   Creatinine, Ser 1.02 (H) 0.44 - 1.00 mg/dL   Calcium 9.0 8.9 - 10.3 mg/dL   GFR calc non Af Amer 51 (L) >60 mL/min   GFR calc Af Amer 59 (L) >60 mL/min   Anion gap 13 5 - 15    Comment: Performed at [facility], [place] [address], [place], [place] [address]  CBC WITH DIFFERENTIAL     Status: Abnormal   Collection Time: 09/07/18  3:42 PM  Result Value Ref Range   WBC 11.9 (H) 4.0 - 10.5 K/uL   RBC 5.06 3.87 - 5.11 MIL/uL   Hemoglobin 15.5 (H) 12.0 - 15.0 g/dL   HCT 47.2 (H) 36.0 - 46.0 %   MCV 93.3 80.0 - 100.0 fL   MCH 30.6 26.0 - 34.0 pg   MCHC 32.8 30.0 - 36.0 g/dL   RDW 13.2 11.5 - 15.5 %   Platelets 267 150 - 400 K/uL   nRBC 0.0 0.0 - 0.2 %   Neutrophils Relative % 76 %   Neutro Abs 9.2 (H) 1.7 - 7.7 K/uL   Lymphocytes Relative 13 %  Lymphs Abs 1.6 0.7 - 4.0 K/uL   Monocytes Relative 6 %   Monocytes Absolute 0.7 0.1 - 1.0 K/uL   Eosinophils Relative 3 %   Eosinophils Absolute 0.3 0.0 - 0.5 K/uL   Basophils Relative 1 %   Basophils Absolute 0.1 0.0 - 0.1 K/uL   Immature Granulocytes 1 %   Abs Immature Granulocytes 0.09 (H) 0.00 - 0.07 K/uL    Comment: Performed at [facility] [place], [place] [address], [place], [place] [address]  Protime-INR     Status: None   Collection Time: 09/07/18  3:42 PM  Result Value Ref Range   Prothrombin Time 12.1 11.4 - 15.2 seconds   INR 0.90     Comment: Performed at [facility], [place] [address], [place], [place] [address]  Type and screen [place]     Status: None   Collection Time: 09/07/18  3:47 PM  Result Value Ref Range   ABO/RH(D) O POS    Antibody Screen NEG    Sample Expiration      09/10/2018 Performed at [facility], [place] [address], [place], [place] [address]   ABO/Rh     Status: None (Preliminary result)   Collection Time: 09/07/18  3:47 PM  Result Value Ref  Range   ABO/RH(D)      O POS Performed at [facility], [place] [address], [place], [place] [address]    Dg Chest 1 View  Result Date: 09/07/2018 CLINICAL DATA:  Fall.  Left hip fracture EXAM: CHEST  1 VIEW COMPARISON:  None. FINDINGS: Heart size upper normal. Negative for heart failure. Negative for infiltrate or effusion. IMPRESSION: No active disease. Electronically Signed   By: Franchot Gallo M.D.   On: 09/07/2018 16:27   Dg Hip Unilat With Pelvis 2-3 Views Left  Result Date: 09/07/2018 CLINICAL DATA:  Fall today.  Hip pain EXAM: DG HIP (WITH OR WITHOUT PELVIS) 2-3V LEFT COMPARISON:  None. FINDINGS: Left femoral neck fracture with impaction and mild displacement. Mild degenerative change left hip joint with mild joint space narrowing. No other acute abnormality in the hip identified. Surgical clips in the pelvis bilaterally IMPRESSION: Impacted fracture left femoral neck. Electronically Signed   By: Franchot Gallo M.D.   On: 09/07/2018 16:26    Pending Labs [employer] (From admission, onward)    Start     Ordered   Signed and Held  CBC  (heparin)  Once,   R    Comments:  Baseline for heparin therapy IF NOT ALREADY DRAWN.  Notify MD if PLT < 100 K.    Signed and Held   Signed and Held  Creatinine, serum  (heparin)  Once,   R    Comments:  Baseline for heparin therapy IF NOT ALREADY DRAWN.    Signed and Held   Signed and Held  CBC  Tomorrow morning,   R     Signed and Held   Signed and Held  Basic metabolic panel  Tomorrow morning,   R     Signed and Held   Signed and Held  Protime-INR  Tomorrow morning,   R     Signed and Held          Vitals/Pain Today's Vitals   09/07/18 1630 09/07/18 1700 09/07/18 1800 09/07/18 1830  BP: 119/73 113/68 125/62 118/62  Pulse: (!) 57 70 77 77  Resp: 19 12 19  (!) 21  Temp:  TempSrc:      SpO2: 92% 94% 91% 92%  Weight:      Height:      PainSc:        Isolation Precautions No active  isolations  Medications Medications  fentaNYL (SUBLIMAZE) injection 75 mcg (75 mcg Intravenous Given 09/07/18 1616)  HYDROmorphone (DILAUDID) injection 1 mg (1 mg Intravenous Given 09/07/18 1656)  ondansetron (ZOFRAN) injection 4 mg (4 mg Intravenous Given 09/07/18 1819)    Mobility non-ambulatory

## 2018-09-07 NOTE — ED Provider Notes (Addendum)
[facility] DEPT Provider Note   CSN: [id number] Arrival date & time: 09/07/18  1504     History   Chief Complaint Chief Complaint  Patient presents with  . Fall  . Hip Pain    HPI Colleen Mclaughlin is a 84 y.o. female with past medical history of hypertension, osteopenia, endometrial cancer status post complete hysterectomy, presenting to the emergency department via EMS after mechanical fall that occurred prior to arrival.  Patient states she was carrying her groceries inside and tripped and fell onto her left side.  She has associated pain to her left hip, mostly in the lateral aspect.  Pain is worse with any movement.  She rates pain 9/10 severity.  She denies head trauma or LOC.  Denies neck or back pain.  No other injuries or pain reported.  She is not on anticoagulation.  Has never injured this hip in the past.  Of note, she recently had carpal tunnel surgery by Dr. Fredna Dow to her left hand on August 29, 2018.  The history is provided by the patient.    Past Medical History:  Diagnosis Date  . Anemia    x2 years ago took med cleared  . Arthritis    general  . Cancer ([place])    endometrial / complete hyst 2009  . Carpal tunnel syndrome on left   . Hypertension   . Osteopenia     There are no active problems to display for this patient.   Past Surgical History:  Procedure Laterality Date  . CARPAL TUNNEL RELEASE Right    2012  . CARPAL TUNNEL RELEASE Left 08/29/2018   Procedure: LEFT CARPAL TUNNEL RELEASE;  Surgeon: Daryll Brod, MD;  Location: [facility];  Service: Orthopedics;  Laterality: Left;  . CATARACT EXTRACTION W/ INTRAOCULAR LENS  IMPLANT, BILATERAL     one approx 5 years ago and the other greater than that but unsure when  . KNEE SURGERY Right    approx 25 years ago "chipped bone"  . RETINAL DETACHMENT SURGERY    . THYROIDECTOMY  1967  . VAGINAL HYSTERECTOMY     2009     OB History   No obstetric history  on file.      Home Medications    Prior to Admission medications   Medication Sig Start Date End Date Taking? Authorizing Provider  aspirin EC 81 MG tablet Take 81 mg by mouth daily.   Yes [provider]  atenolol (TENORMIN) 50 MG tablet Take 50 mg by mouth daily.   Yes [provider]  calcium carbonate (OS-CAL - DOSED IN MG OF ELEMENTAL CALCIUM) 1250 (500 Ca) MG tablet Take 2 tablets by mouth daily with breakfast.   Yes [provider]  levothyroxine (SYNTHROID, LEVOTHROID) 200 MCG tablet Take 200 mcg by mouth daily before breakfast.   Yes [provider]  Multiple Vitamin (MULTI-VITAMIN DAILY PO) Take 1 tablet by mouth daily.    Yes [provider]  Multiple Vitamins-Minerals (ICAPS AREDS 2 PO) Take 1 capsule by mouth daily.    Yes [provider]  omega-3 fish oil (MAXEPA) 1000 MG CAPS capsule Take 1 capsule by mouth daily.    Yes [provider]  triamterene-hydrochlorothiazide (MAXZIDE-25) 37.5-25 MG tablet Take 0.5 tablets by mouth daily.  07/21/18  Yes [provider]  Vitamin D, Ergocalciferol, (DRISDOL) 1.25 MG (50000 UT) CAPS capsule Take 50,000 Units by mouth every 30 (thirty) days.  07/27/18  Yes [provider]  alendronate (FOSAMAX) 70 MG tablet Take 70 mg by mouth once a week. Take with a full glass of water on an empty stomach.    [provider]  cyclobenzaprine (FLEXERIL) 10 MG tablet Take 10 mg by mouth 3 (three) times daily as needed for muscle spasms.     [provider]  traMADol (ULTRAM) 50 MG tablet Take 1 tablet (50 mg total) by mouth every 6 (six) hours as needed. Patient taking differently: Take 50 mg by mouth every 6 (six) hours as needed for moderate pain or severe pain.  08/29/18   Daryll Brod, MD    Family History History reviewed. No pertinent family history.  Social History Social History   Tobacco Use  . Smoking status: Former Smoker    Last attempt to  quit: 1996    Years since quitting: 23.9  . Smokeless tobacco: Never Used  Substance Use Topics  . Alcohol use: Never    Frequency: Never  . Drug use: Never     Allergies   Sulfa antibiotics and Codeine   Review of Systems Review of Systems  Musculoskeletal: Positive for arthralgias. Negative for back pain and neck pain.  Neurological: Negative for syncope.  Hematological: Does not bruise/bleed easily.  All other systems reviewed and are negative.    Physical Exam Updated Vital Signs BP 125/62   Pulse 77   Temp (!) 97.4 F (36.3 C) (Oral)   Resp 19   Ht 5\' 4"  (1.626 m)   Wt 79.4 kg   SpO2 91%   BMI 30.04 kg/m   Physical Exam Vitals signs and nursing note reviewed.  Constitutional:      General: She is not in acute distress.    Appearance: She is well-developed.  HENT:     Head: Normocephalic and atraumatic.  Eyes:     Extraocular Movements: Extraocular movements intact.     Conjunctiva/sclera: Conjunctivae normal.     Pupils: Pupils are equal, round, and reactive to light.  Neck:     Musculoskeletal: Normal range of motion and neck supple. No muscular tenderness.  Cardiovascular:     Rate and Rhythm: Normal rate and regular rhythm.     Pulses: Normal pulses.  Pulmonary:     Effort: Pulmonary effort is normal.     Breath sounds: Normal breath sounds.  Abdominal:     General: Bowel sounds are normal.     Palpations: Abdomen is soft.     Tenderness: There is no abdominal tenderness.  Musculoskeletal:     Comments: Left leg is externally rotated.  There is some tenderness to the lateral aspect of the hip as well as the inguinal region.  Pelvis is stable.  Knee and ankle are nontender and appear atraumatic.  Intact distal sensation. Palmar aspect of the left hand with well-healing surgical wound.  Skin:    General: Skin is warm.  Neurological:     Mental Status: She is alert.  Psychiatric:        Mood and Affect: Mood normal.        Behavior: Behavior  normal.      ED Treatments / Results  Labs (all labs ordered are listed, but only abnormal results are displayed) Labs Reviewed  BASIC METABOLIC PANEL - Abnormal; Notable for the following components:      Result Value   Glucose, Bld 121 (*)    Creatinine, Ser 1.02 (*)    GFR calc non Af Amer 51 (*)  GFR calc Af Amer 59 (*)    All other components within normal limits  CBC WITH DIFFERENTIAL/PLATELET - Abnormal; Notable for the following components:   WBC 11.9 (*)    Hemoglobin 15.5 (*)    HCT 47.2 (*)    Neutro Abs 9.2 (*)    Abs Immature Granulocytes 0.09 (*)    All other components within normal limits  PROTIME-INR  TYPE AND SCREEN  ABO/RH    EKG None  Radiology Dg Chest 1 View  Result Date: 09/07/2018 CLINICAL DATA:  Fall.  Left hip fracture EXAM: CHEST  1 VIEW COMPARISON:  None. FINDINGS: Heart size upper normal. Negative for heart failure. Negative for infiltrate or effusion. IMPRESSION: No active disease. Electronically Signed   By: Franchot Gallo M.D.   On: 09/07/2018 16:27   Dg Hip Unilat With Pelvis 2-3 Views Left  Result Date: 09/07/2018 CLINICAL DATA:  Fall today.  Hip pain EXAM: DG HIP (WITH OR WITHOUT PELVIS) 2-3V LEFT COMPARISON:  None. FINDINGS: Left femoral neck fracture with impaction and mild displacement. Mild degenerative change left hip joint with mild joint space narrowing. No other acute abnormality in the hip identified. Surgical clips in the pelvis bilaterally IMPRESSION: Impacted fracture left femoral neck. Electronically Signed   By: Franchot Gallo M.D.   On: 09/07/2018 16:26    Procedures Procedures (including critical care time)  Medications Ordered in ED Medications  fentaNYL (SUBLIMAZE) injection 75 mcg (75 mcg Intravenous Given 09/07/18 1616)  HYDROmorphone (DILAUDID) injection 1 mg (1 mg Intravenous Given 09/07/18 1656)  ondansetron (ZOFRAN) injection 4 mg (4 mg Intravenous Given 09/07/18 1819)     Initial Impression /  Assessment and Plan / ED Course  I have reviewed the triage vital signs and the nursing notes.  Pertinent labs & imaging results that were available during my care of the patient were reviewed by me and considered in my medical decision making (see chart for details).  Clinical Course as of Sep 07 1834  Thu Sep 07, 2018  1831 Dr. Ara Kussmaul with [facility] accepting admission.   [JR]    Clinical Course User Index [JR] Nicandro Perrault, [place] N, PA-C    Patient presenting via EMS after mechanical fall with left hip pain.  No head trauma or LOC.  Not on anticoagulation.  No other pain reported.  Left hip with closed impacted femoral neck fracture.  Pain treated with improvement.  Patient reports a family friend of Dr. Lorin Mercy with [facility] orthopedics.  Consult placed to this practice, Dr. Ninfa Linden evaluated patient in the emergency department and well operate tomorrow at [facility].  Patient admitted to medicine under Dr. Ara Kussmaul.  Patient discussed with and evaluated by Dr. Alvino Chapel.  The patient appears reasonably stabilized for admission considering the current resources, flow, and capabilities available in the ED at this time, and I doubt any other [facility] requiring further screening and/or treatment in the ED prior to admission.   Final Clinical Impressions(s) / ED Diagnoses   Final diagnoses:  Closed fracture of neck of left femur, initial encounter [facility])  Fall at home, initial encounter    ED Discharge Orders    None       Constantin Hillery, [place] N, PA-C 09/07/18 1835    Adynn Caseres, [place] N, PA-C 09/07/18 1850    Davonna Belling, MD 09/07/18 [phone]

## 2018-09-07 NOTE — H&P (Signed)
History and Physical   [facility] -  @ [place] Admission History and Physical [employer], D.O.    Patient Name: Colleen Mclaughlin MR#: [id number] Date of Birth: March 30, 1933 Date of Admission: 09/07/2018  Referring MD/NP/PA: Martinique Robinson Primary Care Physician: Leanna Battles, MD  Chief Complaint:  Chief Complaint  Patient presents with  . Fall  . Hip Pain    HPI: Colleen Mclaughlin is a 84 y.o. female with a known history of anemia, arthritis, endometrial cancer, carpal tunnel syndrome, hypertension and osteopenia presents to the emergency department for evaluation of hip pain status post mechanical fall.  Patient was in a usual state of health until this evening patient was carrying groceries up a step, tripped and fell onto her left side, denies syncope, preceding symptoms such as chest pain, shortness of breath, dizziness.  She also denies head trauma.  She complains of left hip pain, worse with movement, improved with dilaudid in ER.   Of note patient has had carpal tunnel surgery on December 10 of this year and is supposed to have her stitches out this week.   Patient is functionally independent, lives alone, performs all her own ADLs, can ascend stairs and walk several blocks - she reports slowly due to arthritis, but never chest pain or shortness of breath.  Denies any exertional chest pain, shortness of breath.  Has not had any cardiac work-up other than EKGs.   Patient denies fevers/chills, weakness, dizziness, chest pain, shortness of breath, N/V/C/D, abdominal pain, dysuria/frequency, changes in mental status.    EMS/ED Course: Patient received fentanyl, Dilaudid, Zofran. Medical admission has been requested for further management of impacted left femoral neck fracture.  Review of Systems:  CONSTITUTIONAL: No fever/chills, fatigue, weakness, weight gain/loss, headache. EYES: No blurry or double vision. ENT: No tinnitus, postnasal drip, redness or  soreness of the oropharynx. RESPIRATORY: No cough, dyspnea, wheeze.  No hemoptysis.  CARDIOVASCULAR: No chest pain, palpitations, syncope, orthopnea. No lower extremity edema.  GASTROINTESTINAL: No nausea, vomiting, abdominal pain, diarrhea, constipation.  No hematemesis, melena or hematochezia. GENITOURINARY: No dysuria, frequency, hematuria. ENDOCRINE: No polyuria or nocturia. No heat or cold intolerance. HEMATOLOGY: No anemia, bruising, bleeding. INTEGUMENTARY: No rashes, ulcers, lesions. MUSCULOSKELETAL: Positive left hip pain as per HPI.  No arthritis, gout.  NEUROLOGIC: No numbness, tingling, ataxia, seizure-type activity, weakness. PSYCHIATRIC: No anxiety, depression, insomnia.   Past Medical History:  Diagnosis Date  . Anemia    x2 years ago took med cleared  . Arthritis    general  . Cancer ([place])    endometrial / complete hyst 2009  . Carpal tunnel syndrome on left   . Hypertension   . Osteopenia     Past Surgical History:  Procedure Laterality Date  . CARPAL TUNNEL RELEASE Right    2012  . CARPAL TUNNEL RELEASE Left 08/29/2018   Procedure: LEFT CARPAL TUNNEL RELEASE;  Surgeon: Daryll Brod, MD;  Location: [facility];  Service: Orthopedics;  Laterality: Left;  . CATARACT EXTRACTION W/ INTRAOCULAR LENS  IMPLANT, BILATERAL     one approx 5 years ago and the other greater than that but unsure when  . KNEE SURGERY Right    approx 25 years ago "chipped bone"  . RETINAL DETACHMENT SURGERY    . THYROIDECTOMY  1967  . VAGINAL HYSTERECTOMY     2009     reports that she quit smoking about 23 years ago. She has never used smokeless tobacco. She reports that she does not drink  alcohol or use drugs.  Allergies  Allergen Reactions  . Sulfa Antibiotics Anaphylaxis    Hives   . Codeine Other (See Comments)    Severe headaches     History reviewed. No pertinent family history.  Prior to Admission medications   Medication Sig Start Date End Date Taking?  Authorizing Provider  aspirin EC 81 MG tablet Take 81 mg by mouth daily.   Yes [provider]  atenolol (TENORMIN) 50 MG tablet Take 50 mg by mouth daily.   Yes [provider]  calcium carbonate (OS-CAL - DOSED IN MG OF ELEMENTAL CALCIUM) 1250 (500 Ca) MG tablet Take 2 tablets by mouth daily with breakfast.   Yes [provider]  levothyroxine (SYNTHROID, LEVOTHROID) 200 MCG tablet Take 200 mcg by mouth daily before breakfast.   Yes [provider]  Multiple Vitamin (MULTI-VITAMIN DAILY PO) Take 1 tablet by mouth daily.    Yes [provider]  Multiple Vitamins-Minerals (ICAPS AREDS 2 PO) Take 1 capsule by mouth daily.    Yes [provider]  omega-3 fish oil (MAXEPA) 1000 MG CAPS capsule Take 1 capsule by mouth daily.    Yes [provider]  triamterene-hydrochlorothiazide (MAXZIDE-25) 37.5-25 MG tablet Take 0.5 tablets by mouth daily.  07/21/18  Yes [provider]  Vitamin D, Ergocalciferol, (DRISDOL) 1.25 MG (50000 UT) CAPS capsule Take 50,000 Units by mouth every 30 (thirty) days.  07/27/18  Yes [provider]  alendronate (FOSAMAX) 70 MG tablet Take 70 mg by mouth once a week. Take with a full glass of water on an empty stomach.    [provider]  cyclobenzaprine (FLEXERIL) 10 MG tablet Take 10 mg by mouth 3 (three) times daily as needed for muscle spasms.     [provider]  traMADol (ULTRAM) 50 MG tablet Take 1 tablet (50 mg total) by mouth every 6 (six) hours as needed. Patient taking differently: Take 50 mg by mouth every 6 (six) hours as needed for moderate pain or severe pain.  08/29/18   Daryll Brod, MD    Physical Exam: Vitals:   09/07/18 1523 09/07/18 1630 09/07/18 1700 09/07/18 1800  BP: (!) 124/59 119/73 113/68 125/62  Pulse: 63 (!) 57 70 77  Resp: 18 19 12 19   Temp: (!) 97.4 F (36.3 C)     TempSrc: Oral     SpO2: 95% 92% 94% 91%  Weight:      Height:        GENERAL:  84 y.o.-year-old female patient, well-developed, well-nourished lying in the bed in no acute distress.  Pleasant and cooperative.   HEENT: Head atraumatic, normocephalic. Pupils equal. Mucus membranes moist. NECK: Supple. No JVD. CHEST: Normal breath sounds bilaterally. No wheezing, rales, rhonchi or crackles. No use of accessory muscles of respiration.  No reproducible chest wall tenderness.  CARDIOVASCULAR: S1, S2 normal. Slight SEM at LSB Cap refill <2 seconds. Pulses intact distally.  ABDOMEN: Soft, nondistended, nontender. No rebound, guarding, rigidity. Normoactive bowel sounds present in all four quadrants.  EXTREMITIES: Left leg is shortened and externally rotated.  There is tenderness to palpation over the lateral aspect of the left hip.  No pedal edema, cyanosis, or clubbing. No calf tenderness or Homan's sign.  NEUROLOGIC: The patient is alert and oriented x 3. Cranial nerves II through XII are grossly intact with no focal sensorimotor deficit. PSYCHIATRIC:  Normal affect, mood, thought content. SKIN: Well-healing wound in the palmar aspect of the left wrist.  Warm,  dry, and intact without obvious rash, lesion, or ulcer.    Labs on Admission:  CBC: Recent Labs  Lab 09/07/18 1542  WBC 11.9*  NEUTROABS 9.2*  HGB 15.5*  HCT 47.2*  MCV 93.3  PLT 696   Basic Metabolic Panel: Recent Labs  Lab 09/07/18 1542  NA 140  K 3.7  CL 104  CO2 23  GLUCOSE 121*  BUN 23  CREATININE 1.02*  CALCIUM 9.0   GFR: Estimated Creatinine Clearance: 42.6 mL/min (A) (by C-G formula based on SCr of 1.02 mg/dL (H)). Liver Function Tests: No results for input(s): AST, ALT, ALKPHOS, BILITOT, PROT, ALBUMIN in the last 168 hours. No results for input(s): LIPASE, AMYLASE in the last 168 hours. No results for input(s): AMMONIA in the last 168 hours. Coagulation Profile: Recent Labs  Lab 09/07/18 1542  INR 0.90   Cardiac Enzymes: No results for input(s): CKTOTAL, CKMB, CKMBINDEX, TROPONINI in  the last 168 hours. BNP (last 3 results) No results for input(s): PROBNP in the last 8760 hours. HbA1C: No results for input(s): HGBA1C in the last 72 hours. CBG: No results for input(s): GLUCAP in the last 168 hours. Lipid Profile: No results for input(s): CHOL, HDL, LDLCALC, TRIG, CHOLHDL, LDLDIRECT in the last 72 hours. Thyroid Function Tests: No results for input(s): TSH, T4TOTAL, FREET4, T3FREE, THYROIDAB in the last 72 hours. Anemia Panel: No results for input(s): VITAMINB12, FOLATE, FERRITIN, TIBC, IRON, RETICCTPCT in the last 72 hours. Urine analysis: No results found for: COLORURINE, APPEARANCEUR, LABSPEC, PHURINE, GLUCOSEU, HGBUR, BILIRUBINUR, KETONESUR, PROTEINUR, UROBILINOGEN, NITRITE, LEUKOCYTESUR Sepsis Labs: @LABRCNTIP (procalcitonin:4,lacticidven:4) )No results found for this or any previous visit (from the past 240 hour(s)).   Radiological Exams on Admission: Dg Chest 1 View  Result Date: 09/07/2018 CLINICAL DATA:  Fall.  Left hip fracture EXAM: CHEST  1 VIEW COMPARISON:  None. FINDINGS: Heart size upper normal. Negative for heart failure. Negative for infiltrate or effusion. IMPRESSION: No active disease. Electronically Signed   By: Franchot Gallo M.D.   On: 09/07/2018 16:27   Dg Hip Unilat With Pelvis 2-3 Views Left  Result Date: 09/07/2018 CLINICAL DATA:  Fall today.  Hip pain EXAM: DG HIP (WITH OR WITHOUT PELVIS) 2-3V LEFT COMPARISON:  None. FINDINGS: Left femoral neck fracture with impaction and mild displacement. Mild degenerative change left hip joint with mild joint space narrowing. No other acute abnormality in the hip identified. Surgical clips in the pelvis bilaterally IMPRESSION: Impacted fracture left femoral neck. Electronically Signed   By: Franchot Gallo M.D.   On: 09/07/2018 16:26    EKG: Normal sinus rhythm at 66 bpm with leftwardl axis, LVH, borderline prolonged QT, and nonspecific ST-T wave changes.   Assessment/Plan  This is a 84 y.o. female  with a history of anemia, arthritis, endometrial cancer, carpal tunnel syndrome, hypertension and osteopenia now being admitted with:  #.  Impacted left femoral neck fracture status post mechanical fall - [facility] to [facility] med surg - Dr. Ninfa Linden consulted by emergency department physician - Check EKG, coags and routine labs in the morning - Hold aspirin and fish oil tonight - N.p.o. after midnight - Pain control - Heparin for DVT prophylaxis  #. New heart murmur with abnormal EKG - Check echo and obtain cardio consult in AM  #. History of anemia, stable - Continue to monitor CBC  #. History of hypertension - Continue atenolol, Maxide  #. History of osteopenia - Continue calcium and vitamin D -Hold Fosamax and vitamin D  #.  History  of hypothyroidism Continue levothyroxine  Admission status: Inpatient IV Fluids: [place] Diet/Nutrition: N.p.o. after midnight Consults called: Orthopedics, PT DVT Px: Heparin, SCDs and early ambulation. Code Status: Full Code  Disposition Plan: To be determined  All the records are reviewed and case discussed with ED provider. Management plans discussed with the patient and/or family who express understanding and agree with plan of care.  Harvie Bridge D.O. on 09/07/2018 at 6:37 PM CC: Primary care physician; Leanna Battles, MD   09/07/2018, 6:37 PM

## 2018-09-07 NOTE — ED Notes (Signed)
Pt's L hand incision site was cleaned with sterile saline, and dressed with non-stick gauze and medipore tape.  No redness, edema, or drainage noted

## 2018-09-07 NOTE — Consult Note (Signed)
Reason for consult:  Left hip femoral neck fracture Consulting Provider:  EDP   The patient is a pleasant 84 year old female who sustained an accidental mechanical fall earlier today and she was getting into her house with groceries and accidentally tripped.  She is someone who does use a cane when she mobilizes in the community.  She has not had any previous left hip pain or issues with the hip.  She does have osteoporosis.  After her fall she had the inability to ambulate and severe pain with her left hip.  She is brought to the [facility] via EMS.  She was found to have a displaced left hip femoral neck fracture.  Orthopedic surgery was consulted to evaluate and treat this injury.  She currently denies any headache, chest pain, shortness of breath, fever, chills, nausea, vomiting.  She does report significant left hip pain.  Family is at the bedside.  I am not on call today, but she did request our practice who have treated family members before.  1 of my partners and asked if I could see her since I am operating at [facility] all day tomorrow.  On examination the left lower extremity is shortened and externally rotated consistent with a femoral neck fracture.  She has pain with attempts to rotate the hip on the left side.  Her left foot is well-perfused with normal sensation.  She can move her toes.  She is alert and oriented x3 and in no acute distress but obvious discomfort.  X-rays are independently reviewed of the pelvis and left hip and do show a displaced left hip femoral neck fracture.  I went over this with her and her family.  I am recommending a direct anterior total hip arthroplasty to treat this fracture.  Given the fact that the patient is very mobile and active I do feel that this would be the right treatment option for her.  I have discussed in detail what this involves including the risk and benefits of surgery.  She is being evaluated by the medical service for  clearance for surgery and admission.  She can eat now and will be made n.p.o. after midnight.  Surgery will hopefully be early afternoon tomorrow.  All questions and concerns were answered and addressed.  Her previous medical history and current medications as well as laboratory values have been reviewed.

## 2018-09-07 NOTE — ED Triage Notes (Signed)
Per EMS: Pt walking down front porch steps, and fell.  Pt c/o of L hip pain.  Shortening and rotation noted in L leg.  Swelling is from L hip to L knee. Pt states pain is 9/10

## 2018-09-08 ENCOUNTER — Inpatient Hospital Stay (HOSPITAL_COMMUNITY): Payer: Medicare Other

## 2018-09-08 ENCOUNTER — Inpatient Hospital Stay (HOSPITAL_COMMUNITY): Payer: Medicare Other | Admitting: Anesthesiology

## 2018-09-08 ENCOUNTER — Encounter (HOSPITAL_COMMUNITY): Payer: Self-pay | Admitting: Certified Registered Nurse Anesthetist

## 2018-09-08 ENCOUNTER — Encounter (HOSPITAL_COMMUNITY)
Admission: EM | Disposition: A | Discharge status: Home Health | Payer: Self-pay | Source: Home / Self Care | Attending: Internal Medicine

## 2018-09-08 DIAGNOSIS — S72042A Displaced fracture of base of neck of left femur, initial encounter for closed fracture: Secondary | ICD-10-CM

## 2018-09-08 DIAGNOSIS — Z01810 Encounter for preprocedural cardiovascular examination: Secondary | ICD-10-CM

## 2018-09-08 DIAGNOSIS — Y92009 Unspecified place in unspecified non-institutional (private) residence as the place of occurrence of the external cause: Secondary | ICD-10-CM

## 2018-09-08 DIAGNOSIS — W19XXXA Unspecified fall, initial encounter: Secondary | ICD-10-CM

## 2018-09-08 HISTORY — PX: TOTAL HIP ARTHROPLASTY: SHX124

## 2018-09-08 LAB — BASIC METABOLIC PANEL
Anion gap: 13 (ref 5–15)
BUN: 27 mg/dL — ABNORMAL HIGH (ref 8–23)
CO2: 24 mmol/L (ref 22–32)
Calcium: 8.7 mg/dL — ABNORMAL LOW (ref 8.9–10.3)
Chloride: 102 mmol/L (ref 98–111)
Creatinine, Ser: 0.85 mg/dL (ref 0.44–1.00)
GFR calc Af Amer: >60 mL/min (ref >60)
GFR calc non Af Amer: >60 mL/min (ref >60)
Glucose, Bld: 146 mg/dL — ABNORMAL HIGH (ref 70–99)
POTASSIUM: 4 mmol/L (ref 3.5–5.1)
Sodium: 139 mmol/L (ref 135–145)

## 2018-09-08 LAB — CBC
HCT: 43.9 % (ref 36.0–46.0)
Hemoglobin: 14.4 g/dL (ref 12.0–15.0)
MCH: 31.7 pg (ref 26.0–34.0)
MCHC: 32.8 g/dL (ref 30.0–36.0)
MCV: 96.7 fL (ref 80.0–100.0)
Platelets: 232 10*3/uL (ref 150–400)
RBC: 4.54 MIL/uL (ref 3.87–5.11)
RDW: 13.2 % (ref 11.5–15.5)
WBC: 9.2 10*3/uL (ref 4.0–10.5)
nRBC: 0 % (ref 0.0–0.2)

## 2018-09-08 LAB — ECHOCARDIOGRAM COMPLETE
Height: 64 in
Weight: 2800 oz

## 2018-09-08 LAB — ABO/RH: ABO/RH(D): O POS

## 2018-09-08 LAB — PROTIME-INR
INR: 0.98
PROTHROMBIN TIME: 12.9 s (ref 11.4–15.2)

## 2018-09-08 LAB — MRSA PCR SCREENING: MRSA by PCR: NEGATIVE

## 2018-09-08 SURGERY — ARTHROPLASTY, HIP, TOTAL, ANTERIOR APPROACH
Anesthesia: Spinal | Site: Hip | Laterality: Left

## 2018-09-08 MED ORDER — PROPOFOL 10 MG/ML IV BOLUS
INTRAVENOUS | Status: DC | PRN
  Administered 2018-09-08: 20 mg via INTRAVENOUS

## 2018-09-08 MED ORDER — PHENYLEPHRINE 40 MCG/ML (10ML) SYRINGE FOR IV PUSH (FOR BLOOD PRESSURE SUPPORT)
PREFILLED_SYRINGE | INTRAVENOUS | Status: DC | PRN
  Administered 2018-09-08 (×2): 80 ug via INTRAVENOUS

## 2018-09-08 MED ORDER — BUPIVACAINE IN DEXTROSE 0.75-8.25 % IT SOLN
INTRATHECAL | Status: DC | PRN
  Administered 2018-09-08: 1.6 mL via INTRATHECAL

## 2018-09-08 MED ORDER — METOCLOPRAMIDE HCL 5 MG/ML IJ SOLN: 5.0000 mg | Freq: Three times a day (TID) | INTRAMUSCULAR | Status: DC | PRN

## 2018-09-08 MED ORDER — ACETAMINOPHEN 325 MG PO TABS: 325.0000 mg | ORAL_TABLET | Freq: Four times a day (QID) | ORAL | Status: DC | PRN

## 2018-09-08 MED ORDER — ONDANSETRON HCL 4 MG PO TABS: 4.0000 mg | ORAL_TABLET | Freq: Four times a day (QID) | ORAL | Status: DC | PRN

## 2018-09-08 MED ORDER — EPHEDRINE SULFATE-NACL 50-0.9 MG/10ML-% IV SOSY
PREFILLED_SYRINGE | INTRAVENOUS | Status: DC | PRN
  Administered 2018-09-08: 5 mg via INTRAVENOUS

## 2018-09-08 MED ORDER — PROPOFOL 10 MG/ML IV BOLUS
INTRAVENOUS | Status: AC
  Filled 2018-09-08: qty 20

## 2018-09-08 MED ORDER — SODIUM CHLORIDE 0.9 % IR SOLN
Status: DC | PRN
  Administered 2018-09-08: 1000 mL

## 2018-09-08 MED ORDER — POLYETHYLENE GLYCOL 3350 17 G PO PACK
17.0000 g | PACK | Freq: Every day | ORAL | Status: DC | PRN
  Administered 2018-09-10: 17 g via ORAL
  Filled 2018-09-08: qty 1

## 2018-09-08 MED ORDER — CEFAZOLIN SODIUM-DEXTROSE 2-4 GM/100ML-% IV SOLN
2.0000 g | Freq: Four times a day (QID) | INTRAVENOUS | Status: AC
  Administered 2018-09-08 – 2018-09-09 (×2): 2 g via INTRAVENOUS
  Filled 2018-09-08 (×2): qty 100

## 2018-09-08 MED ORDER — METHOCARBAMOL 500 MG IVPB - SIMPLE MED
500.0000 mg | Freq: Four times a day (QID) | INTRAVENOUS | Status: DC | PRN
  Filled 2018-09-08: qty 50

## 2018-09-08 MED ORDER — HYDROCODONE-ACETAMINOPHEN 7.5-325 MG PO TABS: 1.0000 | ORAL_TABLET | ORAL | Status: DC | PRN

## 2018-09-08 MED ORDER — ONDANSETRON HCL 4 MG/2ML IJ SOLN: 4.0000 mg | Freq: Once | INTRAMUSCULAR | Status: DC | PRN

## 2018-09-08 MED ORDER — PHENYLEPHRINE HCL 10 MG/ML IJ SOLN
INTRAMUSCULAR | Status: AC
  Filled 2018-09-08: qty 1

## 2018-09-08 MED ORDER — FENTANYL CITRATE (PF) 100 MCG/2ML IJ SOLN: 25.0000 ug | INTRAMUSCULAR | Status: DC | PRN

## 2018-09-08 MED ORDER — DOCUSATE SODIUM 100 MG PO CAPS: 100.0000 mg | ORAL_CAPSULE | Freq: Two times a day (BID) | ORAL | Status: DC

## 2018-09-08 MED ORDER — METHOCARBAMOL 500 MG PO TABS
500.0000 mg | ORAL_TABLET | Freq: Four times a day (QID) | ORAL | Status: DC | PRN
  Administered 2018-09-08 – 2018-09-09 (×2): 500 mg via ORAL
  Filled 2018-09-08 (×2): qty 1

## 2018-09-08 MED ORDER — CEFAZOLIN SODIUM-DEXTROSE 2-4 GM/100ML-% IV SOLN
INTRAVENOUS | Status: AC
  Administered 2018-09-08: 2 g via INTRAVENOUS
  Filled 2018-09-08: qty 100

## 2018-09-08 MED ORDER — ONDANSETRON HCL 4 MG/2ML IJ SOLN
INTRAMUSCULAR | Status: AC
  Filled 2018-09-08: qty 2

## 2018-09-08 MED ORDER — METOCLOPRAMIDE HCL 5 MG PO TABS: 5.0000 mg | ORAL_TABLET | Freq: Three times a day (TID) | ORAL | Status: DC | PRN

## 2018-09-08 MED ORDER — ONDANSETRON HCL 4 MG/2ML IJ SOLN
INTRAMUSCULAR | Status: DC | PRN
  Administered 2018-09-08: 4 mg via INTRAVENOUS

## 2018-09-08 MED ORDER — MENTHOL 3 MG MT LOZG: 1.0000 | LOZENGE | OROMUCOSAL | Status: DC | PRN

## 2018-09-08 MED ORDER — 0.9 % SODIUM CHLORIDE (POUR BTL) OPTIME
TOPICAL | Status: DC | PRN
  Administered 2018-09-08: 1000 mL

## 2018-09-08 MED ORDER — ASPIRIN EC 325 MG PO TBEC
325.0000 mg | DELAYED_RELEASE_TABLET | Freq: Every day | ORAL | Status: DC
  Administered 2018-09-09 – 2018-09-10 (×2): 325 mg via ORAL
  Filled 2018-09-08 (×2): qty 1

## 2018-09-08 MED ORDER — ALBUMIN HUMAN 5 % IV SOLN
INTRAVENOUS | Status: AC
  Filled 2018-09-08: qty 250

## 2018-09-08 MED ORDER — FENTANYL CITRATE (PF) 100 MCG/2ML IJ SOLN
50.0000 ug | Freq: Once | INTRAMUSCULAR | Status: AC | PRN
  Administered 2018-09-08: 100 ug via INTRAVENOUS

## 2018-09-08 MED ORDER — LACTATED RINGERS IV SOLN
INTRAVENOUS | Status: DC | PRN
  Administered 2018-09-08 (×2): via INTRAVENOUS

## 2018-09-08 MED ORDER — STERILE WATER FOR IRRIGATION IR SOLN
Status: DC | PRN
  Administered 2018-09-08: 2000 mL

## 2018-09-08 MED ORDER — PHENOL 1.4 % MT LIQD: 1.0000 | OROMUCOSAL | Status: DC | PRN

## 2018-09-08 MED ORDER — PANTOPRAZOLE SODIUM 40 MG PO TBEC
40.0000 mg | DELAYED_RELEASE_TABLET | Freq: Every day | ORAL | Status: DC
  Administered 2018-09-08 – 2018-09-10 (×3): 40 mg via ORAL
  Filled 2018-09-08 (×3): qty 1

## 2018-09-08 MED ORDER — ALBUMIN HUMAN 5 % IV SOLN
INTRAVENOUS | Status: DC | PRN
  Administered 2018-09-08: 15:00:00 via INTRAVENOUS

## 2018-09-08 MED ORDER — FENTANYL CITRATE (PF) 100 MCG/2ML IJ SOLN
INTRAMUSCULAR | Status: AC
  Filled 2018-09-08: qty 2

## 2018-09-08 MED ORDER — ONDANSETRON HCL 4 MG/2ML IJ SOLN
4.0000 mg | Freq: Four times a day (QID) | INTRAMUSCULAR | Status: DC | PRN
  Administered 2018-09-08: 4 mg via INTRAVENOUS
  Filled 2018-09-08: qty 2

## 2018-09-08 MED ORDER — PROPOFOL 500 MG/50ML IV EMUL
INTRAVENOUS | Status: DC | PRN
  Administered 2018-09-08: 75 ug/kg/min via INTRAVENOUS

## 2018-09-08 MED ORDER — ROPIVACAINE HCL 5 MG/ML IJ SOLN
INTRAMUSCULAR | Status: DC | PRN
  Administered 2018-09-08 (×6): 5 mL via PERINEURAL

## 2018-09-08 MED ORDER — CEFAZOLIN SODIUM-DEXTROSE 2-3 GM-%(50ML) IV SOLR
INTRAVENOUS | Status: DC | PRN
  Administered 2018-09-08: 2 g via INTRAVENOUS

## 2018-09-08 MED ORDER — EPHEDRINE 5 MG/ML INJ
INTRAVENOUS | Status: AC
  Filled 2018-09-08: qty 10

## 2018-09-08 MED ORDER — SODIUM CHLORIDE 0.9 % IV SOLN
INTRAVENOUS | Status: DC | PRN
  Administered 2018-09-08: 40 ug/min via INTRAVENOUS

## 2018-09-08 MED ORDER — PHENYLEPHRINE 40 MCG/ML (10ML) SYRINGE FOR IV PUSH (FOR BLOOD PRESSURE SUPPORT)
PREFILLED_SYRINGE | INTRAVENOUS | Status: AC
  Filled 2018-09-08: qty 10

## 2018-09-08 MED ORDER — MORPHINE SULFATE (PF) 2 MG/ML IV SOLN: 0.5000 mg | INTRAVENOUS | Status: DC | PRN

## 2018-09-08 MED ORDER — MEPERIDINE HCL 50 MG/ML IJ SOLN: 6.2500 mg | INTRAMUSCULAR | Status: DC | PRN

## 2018-09-08 MED ORDER — ALUM & MAG HYDROXIDE-SIMETH 200-200-20 MG/5ML PO SUSP: 30.0000 mL | ORAL | Status: DC | PRN

## 2018-09-08 MED ORDER — FENTANYL CITRATE (PF) 100 MCG/2ML IJ SOLN
INTRAMUSCULAR | Status: AC
  Administered 2018-09-08: 100 ug via INTRAVENOUS
  Filled 2018-09-08: qty 2

## 2018-09-08 MED ORDER — TRANEXAMIC ACID-NACL 1000-0.7 MG/100ML-% IV SOLN
INTRAVENOUS | Status: AC
  Filled 2018-09-08: qty 100

## 2018-09-08 MED ORDER — HYDROCODONE-ACETAMINOPHEN 5-325 MG PO TABS
1.0000 | ORAL_TABLET | ORAL | Status: DC | PRN
  Administered 2018-09-08 – 2018-09-09 (×3): 1 via ORAL
  Filled 2018-09-08 (×3): qty 1
  Filled 2018-09-08: qty 2

## 2018-09-08 MED ORDER — SUGAMMADEX SODIUM 200 MG/2ML IV SOLN
INTRAVENOUS | Status: AC
  Filled 2018-09-08: qty 2

## 2018-09-08 SURGICAL SUPPLY — 35 items
BAG ZIPLOCK 12X15 (MISCELLANEOUS) IMPLANT
BENZOIN TINCTURE PRP APPL 2/3 (GAUZE/BANDAGES/DRESSINGS) IMPLANT
BLADE SAW SGTL 18X1.27X75 (BLADE) ×2 IMPLANT
BLADE SURG SZ10 CARB STEEL (BLADE) ×4 IMPLANT
COVER PERINEAL POST (MISCELLANEOUS) ×2 IMPLANT
COVER SURGICAL LIGHT HANDLE (MISCELLANEOUS) ×2 IMPLANT
COVER WAND RF STERILE (DRAPES) IMPLANT
CUP SECTOR GRIPTON 50MM (Cup) ×2 IMPLANT
DRAPE STERI IOBAN 125X83 (DRAPES) ×2 IMPLANT
DRAPE U-SHAPE 47X51 STRL (DRAPES) ×4 IMPLANT
DRSG AQUACEL AG ADV 3.5X10 (GAUZE/BANDAGES/DRESSINGS) ×2 IMPLANT
DURAPREP 26ML APPLICATOR (WOUND CARE) ×2 IMPLANT
ELECT REM PT RETURN 15FT ADLT (MISCELLANEOUS) ×2 IMPLANT
GAUZE XEROFORM 1X8 LF (GAUZE/BANDAGES/DRESSINGS) ×2 IMPLANT
GLOVE BIO SURGEON STRL SZ7.5 (GLOVE) ×2 IMPLANT
GLOVE BIOGEL PI IND STRL 8 (GLOVE) ×2 IMPLANT
GLOVE BIOGEL PI INDICATOR 8 (GLOVE) ×2
GLOVE ECLIPSE 8.0 STRL XLNG CF (GLOVE) ×2 IMPLANT
GOWN STRL REUS W/TWL XL LVL3 (GOWN DISPOSABLE) ×4 IMPLANT
HANDPIECE INTERPULSE COAX TIP (DISPOSABLE) ×1
HEAD FEM STD 32X+1 STRL (Hips) ×2 IMPLANT
HOLDER FOLEY CATH W/STRAP (MISCELLANEOUS) ×2 IMPLANT
LINER ACETABULAR 32X50 (Liner) ×2 IMPLANT
PACK ANTERIOR HIP CUSTOM (KITS) ×2 IMPLANT
SET HNDPC FAN SPRY TIP SCT (DISPOSABLE) ×1 IMPLANT
STAPLER VISISTAT 35W (STAPLE) IMPLANT
STEM CORAIL KA13 (Stem) ×2 IMPLANT
STRIP CLOSURE SKIN 1/2X4 (GAUZE/BANDAGES/DRESSINGS) IMPLANT
SUT ETHIBOND NAB CT1 #1 30IN (SUTURE) ×2 IMPLANT
SUT MNCRL AB 4-0 PS2 18 (SUTURE) IMPLANT
SUT VIC AB 0 CT1 36 (SUTURE) ×2 IMPLANT
SUT VIC AB 1 CT1 36 (SUTURE) ×2 IMPLANT
SUT VIC AB 2-0 CT1 27 (SUTURE) ×2
SUT VIC AB 2-0 CT1 TAPERPNT 27 (SUTURE) ×2 IMPLANT
YANKAUER SUCT BULB TIP 10FT TU (MISCELLANEOUS) ×2 IMPLANT

## 2018-09-08 NOTE — Anesthesia Procedure Notes (Signed)
Spinal  Patient location during procedure: OR Start time: 09/08/2018 2:06 PM End time: 09/08/2018 2:09 PM Staffing Anesthesiologist: Lyn Hollingshead, MD Performed: anesthesiologist  Preanesthetic Checklist Completed: patient identified, site marked, surgical consent, pre-op evaluation, timeout performed, IV checked, risks and benefits discussed and monitors and equipment checked Spinal Block Patient position: sitting Prep: site prepped and draped and DuraPrep Patient monitoring: continuous pulse ox and blood pressure Approach: midline Location: L3-4 Injection technique: single-shot Needle Needle type: Pencan  Needle gauge: 24 G Needle length: 10 cm Needle insertion depth: 6 cm Assessment Sensory level: T8

## 2018-09-08 NOTE — Progress Notes (Signed)
[facility] PROGRESS NOTE    Progress Note  ZI SEK  AXE:[id number] DOB: May 09, 1933 DOA: 09/07/2018 PCP: Leanna Battles, MD     Brief Narrative:   Colleen Mclaughlin is an 84 y.o. female past medical history of anemia, arthritis endometrial cancer hypertension, osteopenia presents to the emergency room after a mechanical fall leading to hip pain, x-ray in the ED show impacted left femoral fracture  Assessment/Plan:   Active Problems:   Fracture of femoral neck, left (HCC) Continue to hold aspirin.  N.p.o. after midnight orthopedic surgery was consulted Commended direct anterior total hip arthroplasty Repeated twelve-lead EKG shows sinus rhythm with atrial premature complexes and LVH. Hemoglobin is stable ranging 15-14 seems to be her baseline. I have explained to the patient the risk and benefits of proceeding with surgery she can exercise more than 4 metabolic equivalents puts her at a low risk of any cardiopulmonary complications. She has agreed to proceed with surgery knowing the risk.  New heart murmur: 2D echo is pending.  Essential hypertension: Continue atenolol and Maxide.  History of osteopenia: Continue calcium and vitamin D hold Fosamax for now.  Hypothyroidism: Continue Synthroid.  DVT prophylaxis: lovenox Family Communication:none Disposition Plan/Barrier to D/C: once surgical procedure completed. Code Status:     Code Status Orders  (From admission, onward)         Start     Ordered   09/07/18 2122  Full code  Continuous     09/07/18 2121        Code Status History    This patient has a current code status but no historical code status.    Advance Directive Documentation     Most Recent Value  Type of Advance Directive  Healthcare Power of Attorney, Living will  Pre-existing out of facility DNR order (yellow form or pink MOST form)  -  "MOST" Form in Place?  -        IV Access:    Peripheral IV   Procedures and  diagnostic studies:   Dg Chest 1 View  Result Date: 09/07/2018 CLINICAL DATA:  Fall.  Left hip fracture EXAM: CHEST  1 VIEW COMPARISON:  None. FINDINGS: Heart size upper normal. Negative for heart failure. Negative for infiltrate or effusion. IMPRESSION: No active disease. Electronically Signed   By: Franchot Gallo M.D.   On: 09/07/2018 16:27   Dg Hip Unilat With Pelvis 2-3 Views Left  Result Date: 09/07/2018 CLINICAL DATA:  Fall today.  Hip pain EXAM: DG HIP (WITH OR WITHOUT PELVIS) 2-3V LEFT COMPARISON:  None. FINDINGS: Left femoral neck fracture with impaction and mild displacement. Mild degenerative change left hip joint with mild joint space narrowing. No other acute abnormality in the hip identified. Surgical clips in the pelvis bilaterally IMPRESSION: Impacted fracture left femoral neck. Electronically Signed   By: Franchot Gallo M.D.   On: 09/07/2018 16:26     Medical Consultants:    None.  Anti-Infectives:   None  Subjective:    [name] relates her pain is controlled will like to proceed with surgery.  Objective:    Vitals:   09/07/18 1830 09/07/18 2003 09/08/18 0148 09/08/18 0505  BP: 118/62 130/73 127/65 119/69  Pulse: 77 85 81 79  Resp: (!) 21 16 15 18   Temp:   97.6 F (36.4 C) 97.7 F (36.5 C)  TempSrc:   Oral Oral  SpO2: 92% 94% 94% 96%  Weight:      Height:  Intake/Output Summary (Last 24 hours) at 09/08/2018 0745 Last data filed at 09/08/2018 0600 Gross per 24 hour  Intake 120 ml  Output 0 ml  Net 120 ml   Filed Weights   09/07/18 1522  Weight: 79.4 kg    Exam: General exam: In no acute distress. Respiratory system: Good air movement and clear to auscultation. Cardiovascular system: Regular rate and rhythm with positive N3-I1 she has systolic murmur in the aortic area. Gastrointestinal system: Abdomen is nondistended, soft and nontender.  Central nervous system: Alert and oriented. No focal neurological  deficits. Extremities: No pedal edema. Skin: No rashes, lesions or ulcers Psychiatry: Judgement and insight appear normal. Mood & affect appropriate.    Data Reviewed:    Labs: Basic Metabolic Panel: Recent Labs  Lab 09/07/18 1542 09/08/18 0426  NA 140 139  K 3.7 4.0  CL 104 102  CO2 23 24  GLUCOSE 121* 146*  BUN 23 27*  CREATININE 1.02* 0.85  CALCIUM 9.0 8.7*   GFR Estimated Creatinine Clearance: 51.1 mL/min (by C-G formula based on SCr of 0.85 mg/dL). Liver Function Tests: No results for input(s): AST, ALT, ALKPHOS, BILITOT, PROT, ALBUMIN in the last 168 hours. No results for input(s): LIPASE, AMYLASE in the last 168 hours. No results for input(s): AMMONIA in the last 168 hours. Coagulation profile Recent Labs  Lab 09/07/18 1542 09/08/18 0426  INR 0.90 0.98    CBC: Recent Labs  Lab 09/07/18 1542 09/08/18 0426  WBC 11.9* 9.2  NEUTROABS 9.2*  --   HGB 15.5* 14.4  HCT 47.2* 43.9  MCV 93.3 96.7  PLT 267 232   Cardiac Enzymes: No results for input(s): CKTOTAL, CKMB, CKMBINDEX, TROPONINI in the last 168 hours. BNP (last 3 results) No results for input(s): PROBNP in the last 8760 hours. CBG: No results for input(s): GLUCAP in the last 168 hours. D-Dimer: No results for input(s): DDIMER in the last 72 hours. Hgb A1c: No results for input(s): HGBA1C in the last 72 hours. Lipid Profile: No results for input(s): CHOL, HDL, LDLCALC, TRIG, CHOLHDL, LDLDIRECT in the last 72 hours. Thyroid function studies: No results for input(s): TSH, T4TOTAL, T3FREE, THYROIDAB in the last 72 hours.  Invalid input(s): FREET3 Anemia work up: No results for input(s): VITAMINB12, FOLATE, FERRITIN, TIBC, IRON, RETICCTPCT in the last 72 hours. Sepsis Labs: Recent Labs  Lab 09/07/18 1542 09/08/18 0426  WBC 11.9* 9.2   Microbiology Recent Results (from the past 240 hour(s))  MRSA PCR Screening     Status: None   Collection Time: 09/08/18 12:58 AM  Result Value Ref Range  Status   MRSA by PCR NEGATIVE NEGATIVE Final    Comment:        The GeneXpert MRSA Assay (FDA approved for NASAL specimens only), is one component of a comprehensive MRSA colonization surveillance program. It is not intended to diagnose MRSA infection nor to guide or monitor treatment for MRSA infections. Performed at [facility], [place] [address], [place], [place] [address]      Medications:   . atenolol  50 mg Oral Daily  . calcium carbonate  2 tablet Oral Q breakfast  . docusate sodium  100 mg Oral BID  . heparin  5,000 Units Subcutaneous Q8H  . levothyroxine  200 mcg Oral QAC breakfast  . multivitamin with minerals   Oral Daily  . triamterene-hydrochlorothiazide  0.5 tablet Oral Daily   Continuous Infusions:    LOS: 1 day   Charlynne Cousins  [facility]   *Please refer to [email or web address], password [email or web address] to get updated schedule on who will round on this patient, as hospitalists switch teams weekly. If 7PM-7AM, please contact night-coverage at [email or web address], password [email or web address] for any overnight needs.  09/08/2018, 7:45 AM

## 2018-09-08 NOTE — Op Note (Signed)
NAME: Colleen Mclaughlin, HINOSTROZA MEDICAL RECORD XB:[id number] ACCOUNT [id number] DATE OF BIRTH:[birth year] FACILITY: [facility] LOCATION: [facility] PHYSICIAN:John Vasconcelos Kerry Fort, MD  OPERATIVE REPORT  DATE OF PROCEDURE:  09/08/2018  PREOPERATIVE DIAGNOSIS:  Left hip displaced femoral neck fracture.  POSTOPERATIVE DIAGNOSIS:  Left hip displaced femoral neck fracture.  PROCEDURE:  Left total hip arthroplasty through direct anterior approach.  IMPLANTS:  DePuy Sector Gription acetabulum acetabular component size 50, size 32+0 neutral polyethylene liner, size 13 Corail femoral component with standard offset, size 32+1 metal hip ball.  SURGEON:  Lind Guest. Ninfa Linden, MD  ASSISTANT:  Erskine Emery, PA-C.  ANESTHESIA:  Spinal.  ANTIBIOTICS:  Two grams IV Ancef.  ESTIMATED BLOOD LOSS:  621 mL  COMPLICATIONS:  None.  INDICATIONS:  The patient is a very pleasant 84 year old female who is an independent ambulator with no previous left hip issues who sustained a mechanical fall when she was going on in her house yesterday after shopping carrying groceries.  She landed  directly on her left hip.  She had the inability to ambulate and was brought to the [facility] Emergency Room and found to have a displaced left hip femoral neck fracture.  Given the fact that she drives and is independent, it was felt that she would  benefit more from a left total hip arthroplasty as opposed to hemiarthroplasty.  I had a long and thorough discussion with her about the surgery.  She was graciously admitted to the medicine service and now presents today for definitive fixation of her  left hip through a left total hip arthroplasty.  The risks and benefits of surgery have been explained to her and her family and they understand the risk of acute blood loss anemia, nerve or vessel injury, fracture, infection, dislocation, DVT and  implant failure.  She understands our goals of decreased pain, improve mobility and overall  improve quality of life.  DESCRIPTION OF PROCEDURE:  After informed consent was obtained and appropriate left hip was marked.  She was brought to the operating room, spinal anesthesia was obtained while she was on her stretcher.  Foley catheter was placed and traction boots were  placed on both her feet.  Next, she was placed supine on the Hana fracture table, perineal post in place and both legs in line skeletal traction device and no traction applied.  Her left operative hip was prepped and draped with DuraPrep and sterile  drapes.  A time-out was called to identify correct patient, correct left hip.  We then made an incision just inferior and posterior to the anterior superior iliac spine and carried this obliquely down the leg.  We dissected down tensor fascia lata  muscle.  Tensor fascia was then divided longitudinally to proceed with direct anterior approach to the hip.  We identified the lateral circumflex vessels and cauterized these.  We opened up the hip capsule in an L-type format finding a large hematoma  consistent with a femoral neck fracture and you could see that there was a displaced femoral neck fracture.  We placed Cobra retractors around the medial and lateral remnants of the femoral neck and made a freshening cut with an oscillating saw just  distal to her fracture but proximal to the lesser trochanter.  I completed this with an osteotome plate.  We placed a corkscrew guide in the femoral head and removed the femoral head in its entirety.  There were no cartilage irregularities.  I then  removed remnants of the acetabular labrum and other debris from  the acetabulum including bony fragments.  We placed a bent Hohmann over the medial acetabular rim and then began reaming under direct visualization from a size 44 reamer, going up to a 49  reamer.  All reamers were placed under direct visualization and fluoroscopy for the last one, so we could obtain our depth of reaming, our inclination  and anteversion.  We then placed the real DePuy Sector Gription acetabular component size 50 and a 32+0  neutral polyethylene liner for that size 50 acetabular component.  Attention was then turned to the femur.  With the leg externally rotated to 120 degrees, extended and adducted, we were able to place a Mueller retractor medially and Hohmann retractor  above the greater trochanter.  We released lateral joint capsule and used a box-cutting osteotome and enter femoral canal and a rongeur to lateralize.  She is a very thin bone.  We were able to start broaching from a size 8 broach using the Corail  broaching system going up to size 13.  With the 13 in place, we trialed a standard offset femoral neck and a 32+1 hip ball, reduced this in the acetabulum.  We were pleased with her offset, leg length and stability on clinical direct fluoroscopy.  We  then dislocated the hip and removed the trial components.  We placed the real Corail femoral component with standard offset size 13 and the real 32+1 metal hip ball and again reduced the acetabulum and appreciated the stability.  Of note, she did ooze  quite a bit during this case from a bleeding standpoint.  We then irrigated the soft tissue with normal saline solution using pulsatile lavage.  We closed the joint capsule with interrupted #1 Ethibond suture, followed by running #1 Vicryl and tensor  fascia, 0 Vicryl in the deep tissue, 2-0 Vicryl subcutaneous tissue and interrupted staples on the skin.  Xeroform and Aquacel dressing was applied.  She was taken off the Hana fracture table and taken to recovery room.  All final counts were correct.   There were no complications noted.  Of note, Benita Stabile, PA-C, assisted the entire case.  His assistance was crucial for facilitating all aspects of this case.  TN/NUANCE  D:09/08/2018 T:09/08/2018 JOB:[id number]

## 2018-09-08 NOTE — Progress Notes (Signed)
  Echocardiogram 2D Echocardiogram has been performed.  Colleen Mclaughlin 09/08/2018, 8:29 AM

## 2018-09-08 NOTE — Anesthesia Procedure Notes (Addendum)
Anesthesia Regional Block: Fascia iliaca block   Pre-Anesthetic Checklist: ,, timeout performed, Correct Patient, Correct Site, Correct Laterality, Correct Procedure, Correct Position, site marked, Risks and benefits discussed,  Surgical consent,  Pre-op evaluation,  At surgeon's request and post-op pain management  Laterality: Left  Prep: chloraprep       Needles:  Injection technique: Single-shot  Needle Type: Echogenic Stimulator Needle     Needle Length: 10cm  Needle Gauge: 21   Needle insertion depth: 1.5 cm   Additional Needles:   Procedures:,,,, ultrasound used (permanent image in chart),,,,  Narrative:  Start time: 09/08/2018 1:40 PM End time: 09/08/2018 1:49 PM Injection made incrementally with aspirations every 5 mL.  Performed by: Personally  Anesthesiologist: Lyn Hollingshead, MD

## 2018-09-08 NOTE — Anesthesia Preprocedure Evaluation (Signed)
Anesthesia Evaluation  Patient identified by MRN, date of birth, ID band Patient awake    Reviewed: Allergy & Precautions, NPO status , Patient's Chart, lab work & pertinent test results, reviewed documented beta blocker date and time   Airway Mallampati: I  TM Distance: >3 FB Neck ROM: Full    Dental   Pulmonary former smoker,    breath sounds clear to auscultation       Cardiovascular hypertension, Pt. on home beta blockers  Rhythm:Regular Rate:Normal     Neuro/Psych  Neuromuscular disease negative psych ROS   GI/Hepatic negative GI ROS, Neg liver ROS,   Endo/Other  Hypothyroidism   Renal/GU negative Renal ROS     Musculoskeletal  (+) Arthritis , Osteoarthritis,    Abdominal Normal abdominal exam  (+)   Peds  Hematology negative hematology ROS (+)   Anesthesia Other Findings   Reproductive/Obstetrics                             Anesthesia Physical  Anesthesia Plan  ASA: III  Anesthesia Plan: Spinal   Post-op Pain Management:  Regional for Post-op pain   Induction: Intravenous  PONV Risk Score and Plan: 2 and Propofol infusion, Ondansetron and Treatment may vary due to age or medical condition  Airway Management Planned: Natural Airway, Simple Face Mask and Nasal Cannula  Additional Equipment:   Intra-op Plan:   Post-operative Plan:   Informed Consent: I have reviewed the patients History and Physical, chart, labs and discussed the procedure including the risks, benefits and alternatives for the proposed anesthesia with the patient or authorized representative who has indicated his/her understanding and acceptance.     Plan Discussed with: CRNA  Anesthesia Plan Comments:         Anesthesia Quick Evaluation

## 2018-09-08 NOTE — Progress Notes (Signed)
Patient ID: Colleen Mclaughlin, female   DOB: [birth year], 84 y.o.   MRN: [id number] The patient and her family agree that we are proceeding to the OR today for surgery on her fractured left hip.  We will be replacing her hip to treat this fracture.  The risks and benefits have been discussed in detail and informed consent is obtained.

## 2018-09-08 NOTE — Brief Op Note (Signed)
09/07/2018 - 09/08/2018  3:17 PM  PATIENT:  Colleen Mclaughlin  84 y.o. female  PRE-OPERATIVE DIAGNOSIS:  left hip femoral neck fracture  POST-OPERATIVE DIAGNOSIS:  left hip femoral neck fracture  PROCEDURE:  Procedure(s): TOTAL HIP ARTHROPLASTY ANTERIOR APPROACH (Left)  SURGEON:  Surgeon(s) and Role:    Mcarthur Rossetti, MD - Primary  PHYSICIAN ASSISTANT: Benita Stabile, PA-C  ANESTHESIA:   spinal  EBL:  500 mL   COUNTS:  YES  TOURNIQUET:  * No tourniquets in log *  DICTATION: .Other Dictation: Dictation Number [id number]  PLAN OF CARE: Admit to inpatient   PATIENT DISPOSITION:  PACU - hemodynamically stable.   Delay start of Pharmacological VTE agent (>24hrs) due to surgical blood loss or risk of bleeding: no

## 2018-09-08 NOTE — Progress Notes (Signed)
PT Cancellation Note  Patient Details Name: Colleen Mclaughlin MRN: [id number] DOB: March 07, 1933   Cancelled Treatment:     Surgery pending.  Please reorder after surgery. Thanks.   Kemet Nijjar,KATHrine E 09/08/2018, 9:31 AM Carmelia Bake, PT, DPT Acute Rehabilitation Services Office: [phone] Pager: [phone]

## 2018-09-08 NOTE — Transfer of Care (Signed)
Immediate Anesthesia Transfer of Care Note  Patient: Colleen Mclaughlin  Procedure(s) Performed: TOTAL HIP ARTHROPLASTY ANTERIOR APPROACH (Left Hip)  Patient Location: PACU  Anesthesia Type:Spinal  Level of Consciousness: awake, alert , oriented and patient cooperative  Airway & Oxygen Therapy: Patient Spontanous Breathing and Patient connected to nasal cannula oxygen  Post-op Assessment: Report given to RN and Post -op Vital signs reviewed and stable  Post vital signs: Reviewed and stable  Last Vitals:  Vitals Value Taken Time  BP 86/48 09/08/2018  3:37 PM  Temp    Pulse 81 09/08/2018  3:39 PM  Resp 22 09/08/2018  3:39 PM  SpO2 95 % 09/08/2018  3:39 PM  Vitals shown include unvalidated device data.  Last Pain:  Vitals:   09/08/18 1346  TempSrc:   PainSc: 0-No pain      Patients Stated Pain Goal: 1 (42/39/53 2023)  Complications: No apparent anesthesia complications

## 2018-09-08 NOTE — Progress Notes (Signed)
Assisted Dr. Jillyn Hidden with left, ultrasound guided fascia iliaca block. Side rails up, monitors on throughout procedure. See vital signs in flow sheet. Tolerated Procedure well.

## 2018-09-08 NOTE — Anesthesia Postprocedure Evaluation (Signed)
Anesthesia Post Note  Patient: Colleen Mclaughlin  Procedure(s) Performed: TOTAL HIP ARTHROPLASTY ANTERIOR APPROACH (Left Hip)     Patient location during evaluation: PACU Anesthesia Type: Spinal Level of consciousness: awake Pain management: pain level controlled Vital Signs Assessment: post-procedure vital signs reviewed and stable Respiratory status: spontaneous breathing Cardiovascular status: stable Postop Assessment: no headache, no backache, spinal receding and no apparent nausea or vomiting Anesthetic complications: no    Last Vitals:  Vitals:   09/08/18 1600 09/08/18 1605  BP: (!) 106/49 (!) 107/46  Pulse: 70 70  Resp: 15 15  Temp:    SpO2: 100% 100%    Last Pain:  Vitals:   09/08/18 1600  TempSrc:   PainSc: 0-No pain   Pain Goal: Patients Stated Pain Goal: 1 (09/08/18 9935)               Huston Foley

## 2018-09-09 LAB — BASIC METABOLIC PANEL
Anion gap: 11 (ref 5–15)
BUN: 20 mg/dL (ref 8–23)
CHLORIDE: 100 mmol/L (ref 98–111)
CO2: 25 mmol/L (ref 22–32)
Calcium: 8.1 mg/dL — ABNORMAL LOW (ref 8.9–10.3)
Creatinine, Ser: 0.86 mg/dL (ref 0.44–1.00)
GFR calc Af Amer: >60 mL/min (ref >60)
GFR calc non Af Amer: >60 mL/min (ref >60)
Glucose, Bld: 123 mg/dL — ABNORMAL HIGH (ref 70–99)
Potassium: 3.6 mmol/L (ref 3.5–5.1)
Sodium: 136 mmol/L (ref 135–145)

## 2018-09-09 LAB — CBC
HCT: 36.2 % (ref 36.0–46.0)
HEMOGLOBIN: 11.9 g/dL — AB (ref 12.0–15.0)
MCH: 31.1 pg (ref 26.0–34.0)
MCHC: 32.9 g/dL (ref 30.0–36.0)
MCV: 94.5 fL (ref 80.0–100.0)
Platelets: 171 10*3/uL (ref 150–400)
RBC: 3.83 MIL/uL — ABNORMAL LOW (ref 3.87–5.11)
RDW: 13.4 % (ref 11.5–15.5)
WBC: 7.6 10*3/uL (ref 4.0–10.5)
nRBC: 0 % (ref 0.0–0.2)

## 2018-09-09 MED ORDER — ALPRAZOLAM 0.25 MG PO TABS
0.2500 mg | ORAL_TABLET | Freq: Once | ORAL | Status: AC
  Administered 2018-09-09: 0.25 mg via ORAL
  Filled 2018-09-09: qty 1

## 2018-09-09 MED ORDER — VITAMIN D (ERGOCALCIFEROL) 1.25 MG (50000 UNIT) PO CAPS
50000.0000 [IU] | ORAL_CAPSULE | ORAL | Status: DC
  Administered 2018-09-09: 50000 [IU] via ORAL
  Filled 2018-09-09: qty 1

## 2018-09-09 NOTE — Plan of Care (Signed)
  Problem: Health Behavior/Discharge Planning: Goal: Ability to manage health-related needs will improve Outcome: Progressing   Problem: Safety: Goal: Ability to remain free from injury will improve Outcome: Progressing   

## 2018-09-09 NOTE — Evaluation (Signed)
Occupational Therapy Evaluation Patient Details Name: Colleen Mclaughlin MRN: [id number] DOB: [birth year] Today's Date: 09/09/2018    History of Present Illness Colleen Mclaughlin is a 84 y.o. female with a known history of anemia, arthritis, endometrial cancer, carpal tunnel syndrome, hypertension and osteopenia presents to the emergency department for evaluation of hip pain status post mechanical fall.  Now s/p  Left total hip arthroplasty through direct anterior approach.  Of note had carpal tunnel surgery 12/10 and due to get stitches out this week. OT noted pt using L hand for ADL activity as needed.  Pt reported no discomfort when using LUE or walking with walker.   Clinical Impression   Pt admitted s/p fall.. Pt currently with functional limitations due to the deficits listed below (see OT Problem List).  Pt will benefit from skilled OT to increase their safety and independence with ADL and functional mobility for ADL to facilitate discharge to venue listed below.      Follow Up Recommendations  Home health OT;Supervision/Assistance - 24 hour    Equipment Recommendations  3 in 1 bedside commode    Recommendations for Other Services       Precautions / Restrictions Precautions Precautions: Fall Precaution Comments: watch O2 sats Restrictions LUE Weight Bearing: Weight bearing as tolerated      Mobility Bed Mobility               General bed mobility comments: pt OOB  Transfers Overall transfer level: Needs assistance Equipment used: Rolling walker (2 wheeled) Transfers: Sit to/from [facility] Sit to Stand: Min assist Stand pivot transfers: Min guard       General transfer comment: for balance/safety and cues for hand placement    Balance Overall balance assessment: Needs assistance;History of Falls Sitting-balance support: Feet supported Sitting balance-Leahy Scale: Fair     Standing balance support: Bilateral upper extremity  supported Standing balance-Leahy Scale: Poor Standing balance comment: UE support for balance                           ADL either performed or assessed with clinical judgement   ADL Overall ADL's : Needs assistance/impaired Eating/Feeding: Set up;Sitting   Grooming: Set up;Sitting   Upper Body Bathing: Set up;Sitting   Lower Body Bathing: Sit to/from stand;Moderate assistance   Upper Body Dressing : Set up;Sitting   Lower Body Dressing: Moderate assistance;Sit to/from stand;Cueing for sequencing;Cueing for safety   Toilet Transfer: Minimal assistance;Ambulation;Comfort height toilet   Toileting- Clothing Manipulation and Hygiene: Minimal assistance;Sit to/from stand         General ADL Comments: VC for hand placment. Education for pt to sit in a chair with arms at home as pt needed arm rails for transition     Vision Patient Visual Report: No change from baseline              Pertinent Vitals/Pain Pain Score: 2  Pain Location: L hip  Pain Descriptors / Indicators: Tender;Sore Pain Intervention(s): Limited activity within patient's tolerance;Monitored during session;Repositioned     Hand Dominance     Extremity/Trunk Assessment     Lower Extremity Assessment LLE Deficits / Details: Limited hip flexion and strength due to pain, able to lift leg on her own, but painful       Communication Communication Communication: HOH   Cognition Arousal/Alertness: Awake/alert Behavior During Therapy: WFL for tasks assessed/performed Overall Cognitive Status: Within Functional Limits for tasks assessed  Home Living Family/patient expects to be discharged to:: Private residence Living Arrangements: Alone Available Help at Discharge: Family;Available 24 hours/day Type of Home: House Home Access: Stairs to enter [employer] of Steps: 1   Home Layout: One level     Bathroom  Shower/Tub: Tub only   Biochemist, clinical: Standard     Home Equipment: Cane - single point;Walker - 2 wheels          Prior Functioning/Environment Level of Independence: Independent        Comments: plans for sister and daughter to stay with her after d/c        OT Problem List: Decreased strength;Impaired balance (sitting and/or standing);Decreased safety awareness;Decreased knowledge of use of DME or AE      OT Treatment/Interventions: Self-care/ADL training;Patient/family education;Therapeutic activities    OT Goals(Current goals can be found in the care plan section) Acute Rehab OT Goals Patient Stated Goal: to go home OT Goal Formulation: With patient Time For Goal Achievement: 09/16/18 Potential to Achieve Goals: Good  OT Frequency: Min 2X/week   Barriers to D/C:               AM-PAC OT "6 Clicks" Daily Activity     Outcome Measure Help from another person eating meals?: None Help from another person taking care of personal grooming?: A Little Help from another person toileting, which includes using toliet, bedpan, or urinal?: A Little Help from another person bathing (including washing, rinsing, drying)?: A Little Help from another person to put on and taking off regular upper body clothing?: A Little Help from another person to put on and taking off regular lower body clothing?: A Lot 6 Click Score: 18   End of Session Equipment Utilized During Treatment: Rolling walker Nurse Communication: Mobility status  Activity Tolerance: Patient tolerated treatment well Patient left: in chair;with call bell/phone within reach;with chair alarm set  OT Visit Diagnosis: Unsteadiness on feet (R26.81);Muscle weakness (generalized) (M62.81);History of falling (Z91.81)                Time: 1505-6979 OT Time Calculation (min): 15 min Charges:  OT General Charges $OT Visit: 1 Visit OT Evaluation $OT Eval Low Complexity: 1 Low  Kari Baars, OT Acute Rehabilitation  Services [phone] Office- [phone], Edwena Felty D 09/09/2018, 5:15 PM

## 2018-09-09 NOTE — Progress Notes (Signed)
Physical Therapy Treatment Patient Details Name: Colleen Mclaughlin MRN: [id number] DOB: Jul 18, 1933 Today's Date: 09/09/2018    History of Present Illness Colleen Mclaughlin is a 84 y.o. female with a known history of anemia, arthritis, endometrial cancer, carpal tunnel syndrome, hypertension and osteopenia presents to the emergency department for evaluation of hip pain status post mechanical fall.  Now s/p  Left total hip arthroplasty through direct anterior approach.  Of note had carpal tunnel surgery 12/10 and due to get stitches out this week.    PT Comments    Patient progressing with ambulation distance this session and able to perform advancement of HEP.  Issued HEP, but further instructions needed.  PT to follow up prior to d/c.   Follow Up Recommendations  Home health PT;Supervision/Assistance - 24 hour(HH aide)     Equipment Recommendations  3in1 (PT)    Recommendations for Other Services       Precautions / Restrictions Precautions Precautions: Fall Precaution Comments: watch O2 sats Restrictions LUE Weight Bearing: Weight bearing as tolerated    Mobility  Bed Mobility               General bed mobility comments: NT  Transfers Overall transfer level: Needs assistance Equipment used: Rolling walker (2 wheeled) Transfers: Sit to/from Stand Sit to Stand: Min guard Stand pivot transfers: Min guard       General transfer comment: up from recliner  Ambulation/Gait Ambulation/Gait assistance: Min guard;Supervision Gait Distance (Feet): 120 Feet Assistive device: Rolling walker (2 wheeled) Gait Pattern/deviations: Step-to pattern;Step-through pattern     General Gait Details: cues for walker safety, keeping it close while getting to chair   Stairs             Wheelchair Mobility    Modified Rankin (Stroke Patients Only)       Balance Overall balance assessment: Needs assistance Sitting-balance support: Feet supported Sitting balance-Leahy  Scale: Good     Standing balance support: Bilateral upper extremity supported Standing balance-Leahy Scale: Fair Standing balance comment: UE support for balance                            Cognition Arousal/Alertness: Awake/alert Behavior During Therapy: WFL for tasks assessed/performed Overall Cognitive Status: Within Functional Limits for tasks assessed                                        Exercises Total Joint Exercises Long Arc Quad: Strengthening;10 reps;Left;Seated Marching in Standing: Strengthening;5 reps;Left;Standing    General Comments General comments (skin integrity, edema, etc.): daughter with pt this pm and observing mobility skills      Pertinent Vitals/Pain Pain Score: 4  Pain Location: L hip  Pain Descriptors / Indicators: Tender;Sore Pain Intervention(s): Monitored during session;Repositioned    Home Living Family/patient expects to be discharged to:: Private residence Living Arrangements: Alone Available Help at Discharge: Family;Available 24 hours/day Type of Home: House Home Access: Stairs to enter   Home Layout: One level Home Equipment: Cane - single point;Walker - 2 wheels      Prior Function Level of Independence: Independent      Comments: plans for sister and daughter to stay with her after d/c   PT Goals (current goals can now be found in the care plan section) Acute Rehab PT Goals Patient Stated Goal: to go home Progress towards PT goals:  Progressing toward goals    Frequency    7X/week      PT Plan Current plan remains appropriate    Co-evaluation              AM-PAC PT "6 Clicks" Mobility   Outcome Measure  Help needed turning from your back to your side while in a flat bed without using bedrails?: A Little Help needed moving from lying on your back to sitting on the side of a flat bed without using bedrails?: A Little Help needed moving to and from a bed to a chair (including a  wheelchair)?: A Little Help needed standing up from a chair using your arms (e.g., wheelchair or bedside chair)?: A Little Help needed to walk in hospital room?: A Little Help needed climbing 3-5 steps with a railing? : A Little 6 Click Score: 18    End of Session Equipment Utilized During Treatment: Gait belt Activity Tolerance: Patient tolerated treatment well Patient left: with call bell/phone within reach;in chair;with chair alarm set;with family/visitor present   PT Visit Diagnosis: Other abnormalities of gait and mobility (R26.89);History of falling (Z91.81);Pain Pain - Right/Left: Left Pain - part of body: Hip     Time: 1411-1435 PT Time Calculation (min) (ACUTE ONLY): 24 min  Charges:  $Gait Training: 8-22 mins $Therapeutic Exercise: 8-22 mins                     Colleen Mclaughlin, [place] Acute Rehabilitation Services [phone] 09/09/2018    Colleen Mclaughlin 09/09/2018, 5:35 PM

## 2018-09-09 NOTE — Progress Notes (Signed)
Subjective: 1 Day Post-Op Procedure(s) (LRB): TOTAL HIP ARTHROPLASTY ANTERIOR APPROACH (Left) Patient reports pain as moderate.  Tolerated surgery very well.  Has already been up this morning with assistance from nursing putting weight on her left hip.  Objective: Vital signs in last 24 hours: Temp:  [97.6 F (36.4 C)-98.5 F (36.9 C)] 98 F (36.7 C) (12/21 0114) Pulse Rate:  [66-99] 99 (12/21 0453) Resp:  [12-23] 16 (12/21 0453) BP: (86-137)/(46-77) 128/64 (12/21 0453) SpO2:  [94 %-100 %] 96 % (12/21 0453)  Intake/Output from previous day: 12/20 0701 - 12/21 0700 In: 2270 [P.O.:820; I.V.:1000; IV Piggyback:450] Out: 2800 [Urine:2300; Blood:500] Intake/Output this shift: Total I/O In: 240 [P.O.:240] Out: -   Recent Labs    09/07/18 1542 09/08/18 0426 09/09/18 0457  HGB 15.5* 14.4 11.9*   Recent Labs    09/08/18 0426 09/09/18 0457  WBC 9.2 7.6  RBC 4.54 3.83*  HCT 43.9 36.2  PLT 232 171   Recent Labs    09/08/18 0426 09/09/18 0457  NA 139 136  K 4.0 3.6  CL 102 100  CO2 24 25  BUN 27* 20  CREATININE 0.85 0.86  GLUCOSE 146* 123*  CALCIUM 8.7* 8.1*   Recent Labs    09/07/18 1542 09/08/18 0426  INR 0.90 0.98    Sensation intact distally Intact pulses distally Dorsiflexion/Plantar flexion intact Incision: scant drainage  Assessment/Plan: 1 Day Post-Op Procedure(s) (LRB): TOTAL HIP ARTHROPLASTY ANTERIOR APPROACH (Left) Up with therapy - WBAT left hip; no precaution. Home vs short-term skilled nursing depending on her progress.    Mcarthur Rossetti 09/09/2018, 8:21 AM

## 2018-09-09 NOTE — Discharge Instructions (Signed)

## 2018-09-09 NOTE — Evaluation (Addendum)
Physical Therapy Evaluation Patient Details Name: Colleen Mclaughlin MRN: [id number] DOB: [birth year] Today's Date: 09/09/2018   History of Present Illness  Colleen Mclaughlin is a 84 y.o. female with a known history of anemia, arthritis, endometrial cancer, carpal tunnel syndrome, hypertension and osteopenia presents to the emergency department for evaluation of hip pain status post mechanical fall.  Now s/p  Left total hip arthroplasty through direct anterior approach.  Of note had carpal tunnel surgery 12/10 and due to get stitches out this week.  Clinical Impression  Patient presents with decreased independence with mobility due to pain, decreased balance, decreased strength and will benefit from skilled PT in the acute setting to allow return home with family support and follow up HHPT.  Currently min A overall to ambulate 31' with RW.  Still needing assist for bed mobility.  She was completely independent prior to fall.      Follow Up Recommendations Home health PT;Supervision/Assistance - 24 hour(HH aide)    Equipment Recommendations  3in1 (PT)    Recommendations for Other Services       Precautions / Restrictions Precautions Precautions: Fall Precaution Comments: watch O2 sats Restrictions LUE Weight Bearing: Weight bearing as tolerated      Mobility  Bed Mobility Overal bed mobility: Needs Assistance Bed Mobility: Supine to Sit     Supine to sit: HOB elevated;Min assist     General bed mobility comments: assist to scoot and for L LE  Transfers Overall transfer level: Needs assistance Equipment used: Rolling walker (2 wheeled) Transfers: Sit to/from Stand Sit to Stand: Min assist         General transfer comment: for balance/safety and cues for hand placement  Ambulation/Gait Ambulation/Gait assistance: Min guard;Min assist Gait Distance (Feet): 90 Feet Assistive device: Rolling walker (2 wheeled) Gait Pattern/deviations: Step-through pattern;Step-to  pattern;Antalgic     General Gait Details: slightly antalgic on R, cues for walker use, assist for balance/safety  Stairs            Wheelchair Mobility    Modified Rankin (Stroke Patients Only)       Balance Overall balance assessment: Needs assistance;History of Falls Sitting-balance support: Feet supported Sitting balance-Leahy Scale: Fair     Standing balance support: Bilateral upper extremity supported Standing balance-Leahy Scale: Poor Standing balance comment: UE support for balance                             Pertinent Vitals/Pain Pain Assessment: 0-10 Pain Score: 3  Pain Location: L hip  Pain Descriptors / Indicators: Tender;Sore Pain Intervention(s): Monitored during session;Repositioned;Ice applied    Home Living Family/patient expects to be discharged to:: Private residence Living Arrangements: Alone Available Help at Discharge: Family;Available 24 hours/day Type of Home: House Home Access: Stairs to enter   [employer] of Steps: 1 Home Layout: One level Home Equipment: Cane - single point;Walker - 2 wheels      Prior Function Level of Independence: Independent         Comments: plans for sister and daughter to stay with her after d/c     Hand Dominance        Extremity/Trunk Assessment   Upper Extremity Assessment Upper Extremity Assessment: Overall WFL for tasks assessed    Lower Extremity Assessment Lower Extremity Assessment: LLE deficits/detail LLE Deficits / Details: Limited hip flexion and strength due to pain, able to lift leg on her own, but painful  Communication   Communication: HOH  Cognition Arousal/Alertness: Awake/alert Behavior During Therapy: WFL for tasks assessed/performed Overall Cognitive Status: Within Functional Limits for tasks assessed                                        General Comments General comments (skin integrity, edema, etc.): sister in room  and seems capable for minor physical assist    Exercises Total Joint Exercises Ankle Circles/Pumps: AROM;10 reps;Both;Supine Quad Sets: AROM;5 reps;Left;Supine Heel Slides: AAROM;Left;Supine;5 reps   Assessment/Plan    PT Assessment Patient needs continued PT services  PT Problem List Decreased strength;Decreased balance;Pain;Decreased range of motion;Decreased mobility;Decreased activity tolerance;Decreased safety awareness       PT Treatment Interventions DME instruction;Functional mobility training;Balance training;Patient/family education;Gait training;Therapeutic exercise;Stair training;Therapeutic activities    PT Goals (Current goals can be found in the Care Plan section)  Acute Rehab PT Goals Patient Stated Goal: to go home PT Goal Formulation: With patient/family Time For Goal Achievement: 09/12/18 Potential to Achieve Goals: Good    Frequency 7X/week   Barriers to discharge        Co-evaluation               AM-PAC PT "6 Clicks" Mobility  Outcome Measure Help needed turning from your back to your side while in a flat bed without using bedrails?: A Little Help needed moving from lying on your back to sitting on the side of a flat bed without using bedrails?: A Little Help needed moving to and from a bed to a chair (including a wheelchair)?: A Little Help needed standing up from a chair using your arms (e.g., wheelchair or bedside chair)?: A Little Help needed to walk in hospital room?: A Little Help needed climbing 3-5 steps with a railing? : A Little 6 Click Score: 18    End of Session Equipment Utilized During Treatment: Gait belt Activity Tolerance: Patient tolerated treatment well Patient left: with call bell/phone within reach;in chair;with chair alarm set;with family/visitor present   PT Visit Diagnosis: Other abnormalities of gait and mobility (R26.89);History of falling (Z91.81);Pain Pain - Right/Left: Left Pain - part of body: Hip    Time:  1102-1130 PT Time Calculation (min) (ACUTE ONLY): 28 min   Charges:   PT Evaluation $PT Eval Moderate Complexity: 1 Mod PT Treatments $Gait Training: 8-22 mins        Magda Kiel, [place] Acute Rehabilitation Services [phone] 09/09/2018   [name] 09/09/2018, 12:53 PM

## 2018-09-09 NOTE — Progress Notes (Signed)
[facility] PROGRESS NOTE    Progress Note  Colleen Mclaughlin  KXF:[id number] DOB: [birth year] DOA: 09/07/2018 PCP: Leanna Battles, MD     Brief Narrative:   Colleen Mclaughlin is an 84 y.o. female past medical history of anemia, arthritis endometrial cancer hypertension, osteopenia presents to the emergency room after a mechanical fall leading to hip pain, x-ray in the ED show impacted left femoral fracture  Assessment/Plan:   Active Problems:   Fracture of femoral neck, left (HCC) S/p  anterior total hip arthroplasty PT evaluation rec Home health PT.  New heart murmur: No AS.  Essential hypertension: Continue atenolol and Maxide.  History of osteopenia: Continue calcium and vitamin D hold Fosamax for now.  Hypothyroidism: Continue Synthroid.  DVT prophylaxis: lovenox Family Communication:none Disposition Plan/Barrier to D/C: home in am Code Status:     Code Status Orders  (From admission, onward)         Start     Ordered   09/07/18 2122  Full code  Continuous     09/07/18 2121        Code Status History    This patient has a current code status but no historical code status.    Advance Directive Documentation     Most Recent Value  Type of Advance Directive  Healthcare Power of Attorney, Living will  Pre-existing out of facility DNR order (yellow form or pink MOST form)  -  "MOST" Form in Place?  -        IV Access:    Peripheral IV   Procedures and diagnostic studies:   Dg Chest 1 View  Result Date: 09/07/2018 CLINICAL DATA:  Fall.  Left hip fracture EXAM: CHEST  1 VIEW COMPARISON:  None. FINDINGS: Heart size upper normal. Negative for heart failure. Negative for infiltrate or effusion. IMPRESSION: No active disease. Electronically Signed   By: Franchot Gallo M.D.   On: 09/07/2018 16:27   Pelvis Portable  Result Date: 09/08/2018 CLINICAL DATA:  Status post left hip replacement EXAM: PORTABLE PELVIS 1-2 VIEWS COMPARISON:   Intraoperative films from earlier in the same day. FINDINGS: Left hip replacement is noted in satisfactory position. No acute bony or soft tissue abnormality is noted. IMPRESSION: Status post left hip replacement. Electronically Signed   By: Inez Catalina M.D.   On: 09/08/2018 16:36   Dg C-arm 1-60 Min-no Report  Result Date: 09/08/2018 Fluoroscopy was utilized by the requesting physician.  No radiographic interpretation.   Dg Hip Operative Unilat W Or W/o Pelvis Left  Result Date: 09/08/2018 CLINICAL DATA:  84 year old female undergoing left hip replacement, femoral neck fracture. EXAM: OPERATIVE LEFT HIP (WITH PELVIS IF PERFORMED) 5 VIEWS TECHNIQUE: Fluoroscopic spot image(s) were submitted for interpretation post-operatively. COMPARISON:  09/07/2018 FINDINGS: Five intraoperative fluoroscopic spot views of the lower pelvis and left hip. Initial image demonstrates a left femoral neck fracture. Subsequent images demonstrate bipolar type left hip arthroplasty placement. On the final images the hardware appears intact and normally aligned. Pre-existing bilateral pelvic sidewall surgical clips. FLUOROSCOPY TIME:  0 minutes 33 seconds 3.36 mGy IMPRESSION: Left femoral neck fracture treated with left hip arthroplasty. No adverse features. Electronically Signed   By: Genevie Ann M.D.   On: 09/08/2018 15:30   Dg Hip Unilat With Pelvis 2-3 Views Left  Result Date: 09/07/2018 CLINICAL DATA:  Fall today.  Hip pain EXAM: DG HIP (WITH OR WITHOUT PELVIS) 2-3V LEFT COMPARISON:  None. FINDINGS: Left femoral neck fracture with impaction and mild displacement.  Mild degenerative change left hip joint with mild joint space narrowing. No other acute abnormality in the hip identified. Surgical clips in the pelvis bilaterally IMPRESSION: Impacted fracture left femoral neck. Electronically Signed   By: Franchot Gallo M.D.   On: 09/07/2018 16:26     Medical Consultants:    None.  Anti-Infectives:   None  Subjective:      Colleen Mclaughlin no complains, has not had a BM  Objective:    Vitals:   09/09/18 0453 09/09/18 0834 09/09/18 1031 09/09/18 1327  BP: 128/64 120/60 115/62 (!) 120/59  Pulse: 99 85 70 70  Resp: 16  16 16   Temp:   98 F (36.7 C) 98.1 F (36.7 C)  TempSrc:   Oral Oral  SpO2: 96%   93%  Weight:      Height:        Intake/Output Summary (Last 24 hours) at 09/09/2018 1336 Last data filed at 09/09/2018 1304 Gross per 24 hour  Intake 2510 ml  Output 2800 ml  Net -290 ml   Filed Weights   09/07/18 1522  Weight: 79.4 kg    Exam: General exam: In no acute distress. Respiratory system: Good air movement and clear to auscultation. Cardiovascular system: Regular rate and rhythm with positive J9-E1 she has systolic murmur in the aortic area. Gastrointestinal system: Abdomen is nondistended, soft and nontender.  Central nervous system: Alert and oriented. No focal neurological deficits. Extremities: No pedal edema. Skin: No rashes, lesions or ulcers Psychiatry: Judgement and insight appear normal. Mood & affect appropriate.    Data Reviewed:    Labs: Basic Metabolic Panel: Recent Labs  Lab 09/07/18 1542 09/08/18 0426 09/09/18 0457  NA 140 139 136  K 3.7 4.0 3.6  CL 104 102 100  CO2 23 24 25   GLUCOSE 121* 146* 123*  BUN 23 27* 20  CREATININE 1.02* 0.85 0.86  CALCIUM 9.0 8.7* 8.1*   GFR Estimated Creatinine Clearance: 50.5 mL/min (by C-G formula based on SCr of 0.86 mg/dL). Liver Function Tests: No results for input(s): AST, ALT, ALKPHOS, BILITOT, PROT, ALBUMIN in the last 168 hours. No results for input(s): LIPASE, AMYLASE in the last 168 hours. No results for input(s): AMMONIA in the last 168 hours. Coagulation profile Recent Labs  Lab 09/07/18 1542 09/08/18 0426  INR 0.90 0.98    CBC: Recent Labs  Lab 09/07/18 1542 09/08/18 0426 09/09/18 0457  WBC 11.9* 9.2 7.6  NEUTROABS 9.2*  --   --   HGB 15.5* 14.4 11.9*  HCT 47.2* 43.9 36.2  MCV 93.3  96.7 94.5  PLT 267 232 171   Cardiac Enzymes: No results for input(s): CKTOTAL, CKMB, CKMBINDEX, TROPONINI in the last 168 hours. BNP (last 3 results) No results for input(s): PROBNP in the last 8760 hours. CBG: No results for input(s): GLUCAP in the last 168 hours. D-Dimer: No results for input(s): DDIMER in the last 72 hours. Hgb A1c: No results for input(s): HGBA1C in the last 72 hours. Lipid Profile: No results for input(s): CHOL, HDL, LDLCALC, TRIG, CHOLHDL, LDLDIRECT in the last 72 hours. Thyroid function studies: No results for input(s): TSH, T4TOTAL, T3FREE, THYROIDAB in the last 72 hours.  Invalid input(s): FREET3 Anemia work up: No results for input(s): VITAMINB12, FOLATE, FERRITIN, TIBC, IRON, RETICCTPCT in the last 72 hours. Sepsis Labs: Recent Labs  Lab 09/07/18 1542 09/08/18 0426 09/09/18 0457  WBC 11.9* 9.2 7.6   Microbiology Recent Results (from the past 240 hour(s))  MRSA PCR Screening  Status: None   Collection Time: 09/08/18 12:58 AM  Result Value Ref Range Status   MRSA by PCR NEGATIVE NEGATIVE Final    Comment:        The GeneXpert MRSA Assay (FDA approved for NASAL specimens only), is one component of a comprehensive MRSA colonization surveillance program. It is not intended to diagnose MRSA infection nor to guide or monitor treatment for MRSA infections. Performed at [facility], [place] [address], [place], [place] [address]      Medications:   . aspirin EC  325 mg Oral Q breakfast  . atenolol  50 mg Oral Daily  . calcium carbonate  2 tablet Oral Q breakfast  . docusate sodium  100 mg Oral BID  . heparin  5,000 Units Subcutaneous Q8H  . levothyroxine  200 mcg Oral QAC breakfast  . multivitamin with minerals   Oral Daily  . pantoprazole  40 mg Oral Daily  . triamterene-hydrochlorothiazide  0.5 tablet Oral Daily   Continuous Infusions: . methocarbamol (ROBAXIN) IV        LOS: 2 days   Colleen Mclaughlin  [facility]   *Please refer to [email or web address], password [email or web address] to get updated schedule on who will round on this patient, as hospitalists switch teams weekly. If 7PM-7AM, please contact night-coverage at [email or web address], password [email or web address] for any overnight needs.  09/09/2018, 1:36 PM

## 2018-09-10 LAB — BASIC METABOLIC PANEL
Anion gap: 11 (ref 5–15)
BUN: 17 mg/dL (ref 8–23)
CO2: 25 mmol/L (ref 22–32)
Calcium: 8.2 mg/dL — ABNORMAL LOW (ref 8.9–10.3)
Chloride: 99 mmol/L (ref 98–111)
Creatinine, Ser: 0.82 mg/dL (ref 0.44–1.00)
GFR calc Af Amer: >60 mL/min (ref >60)
GFR calc non Af Amer: >60 mL/min (ref >60)
Glucose, Bld: 118 mg/dL — ABNORMAL HIGH (ref 70–99)
Potassium: 3.1 mmol/L — ABNORMAL LOW (ref 3.5–5.1)
Sodium: 135 mmol/L (ref 135–145)

## 2018-09-10 MED ORDER — HYDROCODONE-ACETAMINOPHEN 5-325 MG PO TABS: 1.0000 | ORAL_TABLET | Freq: Four times a day (QID) | ORAL | 0 refills | Status: AC | PRN

## 2018-09-10 MED ORDER — ASPIRIN 325 MG PO TBEC: 325.0000 mg | DELAYED_RELEASE_TABLET | Freq: Every day | ORAL | 0 refills | Status: AC

## 2018-09-10 MED ORDER — POTASSIUM CHLORIDE CRYS ER 20 MEQ PO TBCR
40.0000 meq | EXTENDED_RELEASE_TABLET | Freq: Two times a day (BID) | ORAL | Status: DC
  Administered 2018-09-10: 40 meq via ORAL
  Filled 2018-09-10: qty 2

## 2018-09-10 MED ORDER — POLYETHYLENE GLYCOL 3350 17 G PO PACK: 17.0000 g | PACK | Freq: Every day | ORAL | Status: DC

## 2018-09-10 NOTE — Care Management Note (Signed)
Case Management Note  Patient Details  Name: Colleen Mclaughlin MRN: [id number] Date of Birth: 29-Jul-1933  Subjective/Objective:    S/p Left THA, fall                Action/Plan: NCM spoke to pt and dtr at bedside. Offered choice for HH/CMS list provided/placed on chart. Dtr requested [facility] for [facility]. Contacted [facility] for RW and 3n1 bedside commode. And [facility] referral for HHPT. DME delivered to room.   Expected Discharge Date:  09/10/18               Expected Discharge Plan:  [place]  In-House Referral:  NA  Discharge planning Services  CM Consult  Post Acute Care Choice:  Home Health Choice offered to:  Patient, Adult Children  DME Arranged:  3-N-1, Walker rolling DME Agency:  [place]:  PT [place]:  [place]  Status of Service:  Completed, signed off  If discussed at [place] of Stay Meetings, dates discussed:    Additional Comments:  Erenest Rasher, RN 09/10/2018, 5:17 PM

## 2018-09-10 NOTE — Care Management Important Message (Signed)
Important Message  Patient Details  Name: Colleen Mclaughlin MRN: [id number] Date of Birth: 03-16-1933   Medicare Important Message Given:  Yes    Erenest Rasher, RN 09/10/2018, 2:20 PM

## 2018-09-10 NOTE — Progress Notes (Signed)
Physical Therapy Treatment Patient Details Name: Colleen Mclaughlin MRN: [id number] DOB: 1933/04/26 Today's Date: 09/10/2018    History of Present Illness Colleen Mclaughlin is a 84 y.o. female with a known history of anemia, arthritis, endometrial cancer, carpal tunnel syndrome, hypertension and osteopenia presents to the emergency department for evaluation of hip pain status post mechanical fall.  Now s/p  Left total hip arthroplasty through direct anterior approach.  Of note had carpal tunnel surgery 12/10 and due to get stitches out this week.    PT Comments    Pt is progressing well with mobility and is ready to DC home from PT standpoint. STair training completed.   Follow Up Recommendations  Home health PT;Supervision/Assistance - 24 hour(HH aide)     Equipment Recommendations  3in1 (PT)    Recommendations for Other Services       Precautions / Restrictions Precautions Precautions: Fall Precaution Comments: watch O2 sats Restrictions LUE Weight Bearing: Weight bearing as tolerated    Mobility  Bed Mobility   Bed Mobility: Supine to Sit     Supine to sit: Supervision;HOB elevated     General bed mobility comments: up in recliner  Transfers Overall transfer level: Needs assistance Equipment used: Rolling walker (2 wheeled) Transfers: Sit to/from Stand Sit to Stand: Supervision         General transfer comment: supervision for safety  Ambulation/Gait Ambulation/Gait assistance: Supervision Gait Distance (Feet): 80 Feet Assistive device: Rolling walker (2 wheeled) Gait Pattern/deviations: Step-to pattern;Step-through pattern Gait velocity: decr   General Gait Details: steady, no loss of balance   Stairs Stairs: Yes Stairs assistance: Min guard Stair Management: No rails;Backwards Number of Stairs: 1 General stair comments: VCs sequencing, daughter present   Wheelchair Mobility    Modified Rankin (Stroke Patients Only)       Balance Overall  balance assessment: Needs assistance   Sitting balance-Leahy Scale: Good       Standing balance-Leahy Scale: Fair                              Cognition Arousal/Alertness: Awake/alert Behavior During Therapy: WFL for tasks assessed/performed Overall Cognitive Status: Within Functional Limits for tasks assessed                                           General Comments        Pertinent Vitals/Pain Pain Score: 2  Pain Intervention(s): Limited activity within patient's tolerance;Monitored during session;Ice applied;Premedicated before session    Home Living                      Prior Function            PT Goals (current goals can now be found in the care plan section) Acute Rehab PT Goals Patient Stated Goal: to go home PT Goal Formulation: With patient Time For Goal Achievement: 09/12/18 Potential to Achieve Goals: Good Progress towards PT goals: Progressing toward goals    Frequency    7X/week      PT Plan Current plan remains appropriate    Co-evaluation              AM-PAC PT "6 Clicks" Mobility   Outcome Measure  Help needed turning from your back to your side while in a flat bed without using bedrails?: None  Help needed moving from lying on your back to sitting on the side of a flat bed without using bedrails?: None Help needed moving to and from a bed to a chair (including a wheelchair)?: None Help needed standing up from a chair using your arms (e.g., wheelchair or bedside chair)?: None Help needed to walk in hospital room?: None Help needed climbing 3-5 steps with a railing? : A Little 6 Click Score: 23    End of Session Equipment Utilized During Treatment: Gait belt Activity Tolerance: Patient tolerated treatment well Patient left: with call bell/phone within reach;in chair;with chair alarm set;with family/visitor present   PT Visit Diagnosis: Other abnormalities of gait and mobility (R26.89);History  of falling (Z91.81);Pain Pain - Right/Left: Left Pain - part of body: Hip     Time: 1250-1303 PT Time Calculation (min) (ACUTE ONLY): 13 min  Charges:  $Gait Training: 8-22 mins                    Blondell Reveal Kistler PT 09/10/2018  Acute Rehabilitation Services Pager [phone] Office [phone]

## 2018-09-10 NOTE — Discharge Summary (Signed)
Physician Discharge Summary  Colleen Mclaughlin ZES:[id number] DOB: December 20, 1932 DOA: 09/07/2018  PCP: Leanna Battles, MD  Admit date: 09/07/2018 Discharge date: 09/10/2018  Admitted From: home Disposition:  Home  Recommendations for Outpatient Follow-up:  1. Follow up with PCP in 1-2 weeks   Home Health:no Equipment/Devices:Rolling walker  Discharge Condition:stable CODE STATUS:Full Diet recommendation: Heart Healthy  Brief/Interim Summary: 84 y.o. female past medical history of anemia, arthritis endometrial cancer hypertension, osteopenia presents to the emergency room after a mechanical fall leading to hip pain, x-ray in the ED show impacted left femoral fracture  Discharge Diagnoses:  Active Problems:   Fracture of femoral neck, left (HCC) Imaging showed an impacted left femoral fracture, orthopedic surgery was consulted. She status post anterior total hip arthroplasty done on 09/09/2018.  Essential hypertension: No change were made to her medication.  History of osteopenia: No changes made to her medication.  Hypothyroidism: Continue Synthroid.   Discharge Instructions  Discharge Instructions    Diet - low sodium heart healthy   Complete by:  As directed    Increase activity slowly   Complete by:  As directed      Allergies as of 09/10/2018      Reactions   Sulfa Antibiotics Anaphylaxis   Hives   Codeine Other (See Comments)   Severe headaches      Medication List    STOP taking these medications   traMADol 50 MG tablet Commonly known as:  ULTRAM     TAKE these medications   alendronate 70 MG tablet Commonly known as:  FOSAMAX Take 70 mg by mouth once a week. Take with a full glass of water on an empty stomach.   aspirin 325 MG EC tablet Take 1 tablet (325 mg total) by mouth daily with breakfast. What changed:    medication strength  how much to take  when to take this   atenolol 50 MG tablet Commonly known as:  TENORMIN Take 50 mg by  mouth daily.   calcium carbonate 1250 (500 Ca) MG tablet Commonly known as:  OS-CAL - dosed in mg of elemental calcium Take 2 tablets by mouth daily with breakfast.   cyclobenzaprine 10 MG tablet Commonly known as:  FLEXERIL Take 10 mg by mouth 3 (three) times daily as needed for muscle spasms.   HYDROcodone-acetaminophen 5-325 MG tablet Commonly known as:  NORCO/VICODIN Take 1-2 tablets by mouth every 6 (six) hours as needed for up to 7 days for moderate pain (pain score 4-6).   ICAPS AREDS 2 PO Take 1 capsule by mouth daily.   levothyroxine 200 MCG tablet Commonly known as:  SYNTHROID, LEVOTHROID Take 200 mcg by mouth daily before breakfast.   MULTI-VITAMIN DAILY PO Take 1 tablet by mouth daily.   omega-3 fish oil 1000 MG Caps capsule Commonly known as:  MAXEPA Take 1 capsule by mouth daily.   triamterene-hydrochlorothiazide 37.5-25 MG tablet Commonly known as:  MAXZIDE-25 Take 0.5 tablets by mouth daily.   Vitamin D (Ergocalciferol) 1.25 MG (50000 UT) Caps capsule Commonly known as:  DRISDOL Take 50,000 Units by mouth every 30 (thirty) days.            Durable Medical Equipment  (From admission, onward)         Start     Ordered   09/09/18 1347  For home use only DME Walker  Once    Question:  Patient needs a walker to treat with the following condition  Answer:  Hip fracture ([place])  09/09/18 1346         Follow-up Information    Mcarthur Rossetti, MD. Schedule an appointment as soon as possible for a visit in 2 week(s).   Specialty:  Orthopedic Surgery Contact information: [address] [place] [address] [phone]          Allergies  Allergen Reactions  . Sulfa Antibiotics Anaphylaxis    Hives   . Codeine Other (See Comments)    Severe headaches     Consultations:  Orthopedics   Procedures/Studies: Dg Chest 1 View  Result Date: 09/07/2018 CLINICAL DATA:  Fall.  Left hip fracture EXAM: CHEST  1 VIEW  COMPARISON:  None. FINDINGS: Heart size upper normal. Negative for heart failure. Negative for infiltrate or effusion. IMPRESSION: No active disease. Electronically Signed   By: Franchot Gallo M.D.   On: 09/07/2018 16:27   Pelvis Portable  Result Date: 09/08/2018 CLINICAL DATA:  Status post left hip replacement EXAM: PORTABLE PELVIS 1-2 VIEWS COMPARISON:  Intraoperative films from earlier in the same day. FINDINGS: Left hip replacement is noted in satisfactory position. No acute bony or soft tissue abnormality is noted. IMPRESSION: Status post left hip replacement. Electronically Signed   By: Inez Catalina M.D.   On: 09/08/2018 16:36   Dg C-arm 1-60 Min-no Report  Result Date: 09/08/2018 Fluoroscopy was utilized by the requesting physician.  No radiographic interpretation.   Dg Hip Operative Unilat W Or W/o Pelvis Left  Result Date: 09/08/2018 CLINICAL DATA:  84 year old female undergoing left hip replacement, femoral neck fracture. EXAM: OPERATIVE LEFT HIP (WITH PELVIS IF PERFORMED) 5 VIEWS TECHNIQUE: Fluoroscopic spot image(s) were submitted for interpretation post-operatively. COMPARISON:  09/07/2018 FINDINGS: Five intraoperative fluoroscopic spot views of the lower pelvis and left hip. Initial image demonstrates a left femoral neck fracture. Subsequent images demonstrate bipolar type left hip arthroplasty placement. On the final images the hardware appears intact and normally aligned. Pre-existing bilateral pelvic sidewall surgical clips. FLUOROSCOPY TIME:  0 minutes 33 seconds 3.36 mGy IMPRESSION: Left femoral neck fracture treated with left hip arthroplasty. No adverse features. Electronically Signed   By: Genevie Ann M.D.   On: 09/08/2018 15:30   Dg Hip Unilat With Pelvis 2-3 Views Left  Result Date: 09/07/2018 CLINICAL DATA:  Fall today.  Hip pain EXAM: DG HIP (WITH OR WITHOUT PELVIS) 2-3V LEFT COMPARISON:  None. FINDINGS: Left femoral neck fracture with impaction and mild displacement. Mild  degenerative change left hip joint with mild joint space narrowing. No other acute abnormality in the hip identified. Surgical clips in the pelvis bilaterally IMPRESSION: Impacted fracture left femoral neck. Electronically Signed   By: Franchot Gallo M.D.   On: 09/07/2018 16:26    (Echo, Carotid, EGD, Colonoscopy, ERCP)    Subjective: No complains feels great Discharge Exam: Vitals:   09/09/18 2130 09/10/18 0505  BP: 125/69 (!) 120/49  Pulse: 79 73  Resp: 16 18  Temp: 98.2 F (36.8 C) 98 F (36.7 C)  SpO2: 94% 97%   Vitals:   09/09/18 1031 09/09/18 1327 09/09/18 2130 09/10/18 0505  BP: 115/62 (!) 120/59 125/69 (!) 120/49  Pulse: 70 70 79 73  Resp: 16 16 16 18   Temp: 98 F (36.7 C) 98.1 F (36.7 C) 98.2 F (36.8 C) 98 F (36.7 C)  TempSrc: Oral Oral Oral Oral  SpO2:  93% 94% 97%  Weight:      Height:        General: Pt is alert, awake, not in acute  distress Cardiovascular: RRR, S1/S2 +, no rubs, no gallops Respiratory: CTA bilaterally, no wheezing, no rhonchi Abdominal: Soft, NT, ND, bowel sounds + Extremities: no edema, no cyanosis    The results of significant diagnostics from this hospitalization (including imaging, microbiology, ancillary and laboratory) are listed below for reference.     Microbiology: Recent Results (from the past 240 hour(s))  MRSA PCR Screening     Status: None   Collection Time: 09/08/18 12:58 AM  Result Value Ref Range Status   MRSA by PCR NEGATIVE NEGATIVE Final    Comment:        The GeneXpert MRSA Assay (FDA approved for NASAL specimens only), is one component of a comprehensive MRSA colonization surveillance program. It is not intended to diagnose MRSA infection nor to guide or monitor treatment for MRSA infections. Performed at [facility], [place] [address] [place], [place] [address]      Labs: BNP (last 3 results) No results for input(s): BNP in the last 8760 hours. Basic Metabolic Panel: Recent  Labs  Lab 09/07/18 1542 09/08/18 0426 09/09/18 0457 09/10/18 0403  NA 140 139 136 135  K 3.7 4.0 3.6 3.1*  CL 104 102 100 99  CO2 23 24 25 25   GLUCOSE 121* 146* 123* 118*  BUN 23 27* 20 17  CREATININE 1.02* 0.85 0.86 0.82  CALCIUM 9.0 8.7* 8.1* 8.2*   Liver Function Tests: No results for input(s): AST, ALT, ALKPHOS, BILITOT, PROT, ALBUMIN in the last 168 hours. No results for input(s): LIPASE, AMYLASE in the last 168 hours. No results for input(s): AMMONIA in the last 168 hours. CBC: Recent Labs  Lab 09/07/18 1542 09/08/18 0426 09/09/18 0457  WBC 11.9* 9.2 7.6  NEUTROABS 9.2*  --   --   HGB 15.5* 14.4 11.9*  HCT 47.2* 43.9 36.2  MCV 93.3 96.7 94.5  PLT 267 232 171   Cardiac Enzymes: No results for input(s): CKTOTAL, CKMB, CKMBINDEX, TROPONINI in the last 168 hours. BNP: Invalid input(s): POCBNP CBG: No results for input(s): GLUCAP in the last 168 hours. D-Dimer No results for input(s): DDIMER in the last 72 hours. Hgb A1c No results for input(s): HGBA1C in the last 72 hours. Lipid Profile No results for input(s): CHOL, HDL, LDLCALC, TRIG, CHOLHDL, LDLDIRECT in the last 72 hours. Thyroid function studies No results for input(s): TSH, T4TOTAL, T3FREE, THYROIDAB in the last 72 hours.  Invalid input(s): FREET3 Anemia work up No results for input(s): VITAMINB12, FOLATE, FERRITIN, TIBC, IRON, RETICCTPCT in the last 72 hours. Urinalysis No results found for: COLORURINE, APPEARANCEUR, [place], [place], [place], [place], [place], [place], PROTEINUR, UROBILINOGEN, NITRITE, LEUKOCYTESUR Sepsis Labs Invalid input(s): PROCALCITONIN,  WBC,  LACTICIDVEN Microbiology Recent Results (from the past 240 hour(s))  MRSA PCR Screening     Status: None   Collection Time: 09/08/18 12:58 AM  Result Value Ref Range Status   MRSA by PCR NEGATIVE NEGATIVE Final    Comment:        The GeneXpert MRSA Assay (FDA approved for NASAL specimens only), is one component of  a comprehensive MRSA colonization surveillance program. It is not intended to diagnose MRSA infection nor to guide or monitor treatment for MRSA infections. Performed at [facility], [place] [address] [place], [place] [address]      Time coordinating discharge: 40 minutes  SIGNED:   Charlynne Cousins, MD  [facility] 09/10/2018, 12:32 PM Pager   If 7PM-7AM, please contact night-coverage [email or web address] Password [email or web address]

## 2018-09-10 NOTE — Progress Notes (Signed)
Pt stable at time of d/c. No needs at time of d/c. Pt was given home equipment prior to d/c.

## 2018-09-10 NOTE — Progress Notes (Signed)
Physical Therapy Treatment Patient Details Name: Colleen Mclaughlin MRN: [id number] DOB: October 21, 1932 Today's Date: 09/10/2018    History of Present Illness Colleen Mclaughlin is a 84 y.o. female with a known history of anemia, arthritis, endometrial cancer, carpal tunnel syndrome, hypertension and osteopenia presents to the emergency department for evaluation of hip pain status post mechanical fall.  Now s/p  Left total hip arthroplasty through direct anterior approach.  Of note had carpal tunnel surgery 12/10 and due to get stitches out this week.    PT Comments    Pt ambulated 120' with RW and performed THA exercises with min assist. She is progressing well with mobility.   Follow Up Recommendations  Home health PT;Supervision/Assistance - 24 hour(HH aide)     Equipment Recommendations  3in1 (PT)    Recommendations for Other Services       Precautions / Restrictions Precautions Precautions: Fall Precaution Comments: watch O2 sats Restrictions Weight Bearing Restrictions: No LUE Weight Bearing: Weight bearing as tolerated    Mobility  Bed Mobility   Bed Mobility: Supine to Sit     Supine to sit: Supervision;HOB elevated        Transfers Overall transfer level: Needs assistance Equipment used: Rolling walker (2 wheeled) Transfers: Sit to/from Stand Sit to Stand: Min guard         General transfer comment: VCs hand placement  Ambulation/Gait Ambulation/Gait assistance: Min guard;Supervision Gait Distance (Feet): 120 Feet Assistive device: Rolling walker (2 wheeled) Gait Pattern/deviations: Step-to pattern;Step-through pattern Gait velocity: decr   General Gait Details: cues for walker safety, keeping it close while getting to chair, no loss of balance   Stairs             Wheelchair Mobility    Modified Rankin (Stroke Patients Only)       Balance Overall balance assessment: Needs assistance   Sitting balance-Leahy Scale: Good       Standing  balance-Leahy Scale: Fair                              Cognition Arousal/Alertness: Awake/alert Behavior During Therapy: WFL for tasks assessed/performed Overall Cognitive Status: Within Functional Limits for tasks assessed                                        Exercises Total Joint Exercises Ankle Circles/Pumps: AROM;10 reps;Both;Supine Heel Slides: AAROM;Left;Supine;10 reps Hip ABduction/ADduction: AAROM;Left;10 reps;Supine Long Arc Quad: Strengthening;10 reps;Left;Seated    General Comments        Pertinent Vitals/Pain Pain Score: 0-No pain Pain Intervention(s): Ice applied    Home Living                      Prior Function            PT Goals (current goals can now be found in the care plan section) Acute Rehab PT Goals Patient Stated Goal: to go home PT Goal Formulation: With patient Time For Goal Achievement: 09/12/18 Potential to Achieve Goals: Good Progress towards PT goals: Progressing toward goals    Frequency    7X/week      PT Plan Current plan remains appropriate    Co-evaluation              AM-PAC PT "6 Clicks" Mobility   Outcome Measure  Help needed turning from your  back to your side while in a flat bed without using bedrails?: A Little Help needed moving from lying on your back to sitting on the side of a flat bed without using bedrails?: A Little Help needed moving to and from a bed to a chair (including a wheelchair)?: A Little Help needed standing up from a chair using your arms (e.g., wheelchair or bedside chair)?: A Little Help needed to walk in hospital room?: A Little Help needed climbing 3-5 steps with a railing? : A Little 6 Click Score: 18    End of Session Equipment Utilized During Treatment: Gait belt Activity Tolerance: Patient tolerated treatment well Patient left: with call bell/phone within reach;in chair;with chair alarm set   PT Visit Diagnosis: Other abnormalities of  gait and mobility (R26.89);History of falling (Z91.81);Pain Pain - Right/Left: Left Pain - part of body: Hip     Time: 7564-3329 PT Time Calculation (min) (ACUTE ONLY): 17 min  Charges:  $Gait Training: 8-22 mins                     Blondell Reveal Kistler PT 09/10/2018  Acute Rehabilitation Services Pager [phone] Office [phone]

## 2018-09-10 NOTE — Progress Notes (Addendum)
CSW consulted for SNF placement. Per MD, PT, and OT notes, patient recommended home w/ [facility] and will d/c home.  CSW signing off at this time. Please re-consult if need arises.  Pricilla Holm, MSW, [place] [facility] [phone]

## 2018-09-10 NOTE — Progress Notes (Signed)
Subjective: 2 Days Post-Op Procedure(s) (LRB): TOTAL HIP ARTHROPLASTY ANTERIOR APPROACH (Left) Patient reports pain as mild.  No complaints.   Objective: Vital signs in last 24 hours: Temp:  [98 F (36.7 C)-98.2 F (36.8 C)] 98 F (36.7 C) (12/22 0505) Pulse Rate:  [70-85] 73 (12/22 0505) Resp:  [16-18] 18 (12/22 0505) BP: (115-125)/(49-69) 120/49 (12/22 0505) SpO2:  [93 %-97 %] 97 % (12/22 0505)  Intake/Output from previous day: 12/21 0701 - 12/22 0700 In: 1200 [P.O.:1200] Out: 900 [Urine:900] Intake/Output this shift: No intake/output data recorded.  Recent Labs    09/07/18 1542 09/08/18 0426 09/09/18 0457  HGB 15.5* 14.4 11.9*   Recent Labs    09/08/18 0426 09/09/18 0457  WBC 9.2 7.6  RBC 4.54 3.83*  HCT 43.9 36.2  PLT 232 171   Recent Labs    09/09/18 0457 09/10/18 0403  NA 136 135  K 3.6 3.1*  CL 100 99  CO2 25 25  BUN 20 17  CREATININE 0.86 0.82  GLUCOSE 123* 118*  CALCIUM 8.1* 8.2*   Recent Labs    09/07/18 1542 09/08/18 0426  INR 0.90 0.98   Left lower extremity: Dorsiflexion/Plantar flexion intact Incision: moderate drainage Compartment soft    Assessment/Plan: 2 Days Post-Op Procedure(s) (LRB): TOTAL HIP ARTHROPLASTY ANTERIOR APPROACH (Left) Up with therapy    Darly Massi 09/10/2018, 8:19 AM

## 2018-09-11 ENCOUNTER — Encounter (HOSPITAL_COMMUNITY): Payer: Self-pay | Admitting: Orthopaedic Surgery

## 2018-09-12 NOTE — Addendum Note (Signed)
Addendum  created 09/12/18 2156 by Lyn Hollingshead, MD   Clinical Note Signed, Intraprocedure Blocks edited

## 2018-09-18 ENCOUNTER — Telehealth (INDEPENDENT_AMBULATORY_CARE_PROVIDER_SITE_OTHER): Payer: Self-pay

## 2018-09-18 NOTE — Telephone Encounter (Signed)
Can you do me a favor and call patient to set up an appointment with either Artis Delay or Ninfa Linden, whichever is first available. Ninfa Linden got a message from her daughter saying they have called a few times and left messages with no response

## 2018-09-19 ENCOUNTER — Encounter (INDEPENDENT_AMBULATORY_CARE_PROVIDER_SITE_OTHER): Payer: Self-pay | Admitting: Orthopaedic Surgery

## 2018-09-19 ENCOUNTER — Ambulatory Visit (INDEPENDENT_AMBULATORY_CARE_PROVIDER_SITE_OTHER): Payer: Medicare Other | Admitting: Orthopaedic Surgery

## 2018-09-19 DIAGNOSIS — Z96642 Presence of left artificial hip joint: Secondary | ICD-10-CM

## 2018-09-19 NOTE — Progress Notes (Signed)
The patient is 11 days status post a left total hip arthroplasty to treat an acute femoral neck fracture that was displaced.  She is very active 84 year old female.  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  We placed a total hip direct anterior approach the day of her injury.  On exam her incision looks good.  I left the staples in because is only been 11 days.  I did drain a 30 cc seroma from the soft tissues.  I placed new dressing as well.  She can get this dressing wet and will leave it on for the next week.  Next week we will see her back but no x-rays are needed.  This will be just to mainly remove staples and placed Steri-Strips.  All question concerns were answered and addressed.  She will continue increase her activities as comfort allows.  Should go back to 81 mg aspirin daily.

## 2018-09-26 ENCOUNTER — Encounter (INDEPENDENT_AMBULATORY_CARE_PROVIDER_SITE_OTHER): Payer: Self-pay | Admitting: Orthopaedic Surgery

## 2018-09-26 ENCOUNTER — Ambulatory Visit (INDEPENDENT_AMBULATORY_CARE_PROVIDER_SITE_OTHER): Payer: Medicare Other | Admitting: Orthopaedic Surgery

## 2018-09-26 DIAGNOSIS — Z96642 Presence of left artificial hip joint: Secondary | ICD-10-CM | POA: Insufficient documentation

## 2018-09-26 NOTE — Progress Notes (Signed)
The patient is now 18 days status post a left total hip arthroplasty to treat an acute left hip fracture.  She is a very motivated 85 year old female who only ambulate with a cane before and gets out in the community quite a bit.  She also drives.  She has not driven since surgery.  On exam her incision looks great.  Remove the staples in place Steri-Strips.  She does have a mild seroma and I drained about 30 cc of fluid from the area.  Her leg lengths appear equal.  At this point she can get her incision wet in the shower daily.  She can stop her aspirin.  She will increase her activities as comfort allows.  We will see her back in 4 weeks to see how she is doing overall but no x-rays are needed.  All question concerns were answered and addressed.

## 2018-10-24 ENCOUNTER — Ambulatory Visit (INDEPENDENT_AMBULATORY_CARE_PROVIDER_SITE_OTHER): Payer: Medicare Other | Admitting: Orthopaedic Surgery

## 2018-10-24 ENCOUNTER — Encounter (INDEPENDENT_AMBULATORY_CARE_PROVIDER_SITE_OTHER): Payer: Self-pay | Admitting: Orthopaedic Surgery

## 2018-10-24 DIAGNOSIS — Z96642 Presence of left artificial hip joint: Secondary | ICD-10-CM

## 2018-10-24 NOTE — Progress Notes (Signed)
HPI: Colleen Mclaughlin returns today 40 days status post left total hip arthroplasty.  She is overall doing well.  She is walking with a cane in her left hand but states is due to right knee pain.  He has no complaints in regards to the left hip.  She has had no fevers chills shortness of breath chest pain.  She does feel that she has a rock on the right buttock sometimes whenever she is sitting.  Physical exam: Left hip good range of motion without pain.  Left calf supple nontender.  Impression: Status post left total hip arthroplasty 09/08/2018  Plan: She will continue work on range of motion strengthening left hip.  Scar tissue mobilization is encouraged.  Follow-up with [place] in 3 months sooner if there is any questions concerns.  Questions were encouraged and answered.

## 2018-11-23 ENCOUNTER — Telehealth (INDEPENDENT_AMBULATORY_CARE_PROVIDER_SITE_OTHER): Payer: Self-pay | Admitting: Orthopaedic Surgery

## 2018-11-23 NOTE — Telephone Encounter (Signed)
Patient called advised she has a dentist appointment to have her teeth cleaned today at noon. Patient asked if she needed an antibiotic? The number to contact patient is [phone]

## 2018-11-23 NOTE — Telephone Encounter (Signed)
Called patient back and LMOM for her apologizing that I just got her message and to call me back if dentist didn't give her antibiotic

## 2019-01-22 ENCOUNTER — Encounter: Payer: Self-pay | Admitting: Orthopaedic Surgery

## 2019-01-22 ENCOUNTER — Ambulatory Visit: Payer: Medicare Other | Admitting: Orthopaedic Surgery

## 2019-01-22 ENCOUNTER — Other Ambulatory Visit: Payer: Self-pay

## 2019-01-22 DIAGNOSIS — Z96642 Presence of left artificial hip joint: Secondary | ICD-10-CM

## 2019-01-22 NOTE — Progress Notes (Signed)
HPI: Mrs. Coltrin returns today follow-up of her left total hip arthroplasty which she had due to a hip fracture.  She states that left hip is doing great.  She has no complaints.  She has had no problems with the wound incision.  She denies any numbness or tingling down the leg.  Said no fevers chills shortness of breath.  Review of systems: See HPI otherwise negative  Physical exam: General well-developed well-nourished female no acute distress mood affect appropriate.  Left hip: Excellent range of motion without pain.  Nontender over the left trochanteric region.  Left calf supple nontender dorsiflexion plantarflexion ankle intact.   Impression status post: Left total hip arthroplasty 09/08/2018  Plan: We will see her back at 1 year postop at that time we will obtain AP pelvis and lateral view of the left hip.  We will see her back sooner if there is any questions concerns.

## 2019-08-23 ENCOUNTER — Encounter: Payer: Self-pay | Admitting: Orthopaedic Surgery

## 2019-08-23 ENCOUNTER — Other Ambulatory Visit: Payer: Self-pay

## 2019-08-23 ENCOUNTER — Ambulatory Visit: Payer: Self-pay

## 2019-08-23 ENCOUNTER — Ambulatory Visit (INDEPENDENT_AMBULATORY_CARE_PROVIDER_SITE_OTHER): Payer: Medicare Other | Admitting: Orthopaedic Surgery

## 2019-08-23 DIAGNOSIS — G8929 Other chronic pain: Secondary | ICD-10-CM | POA: Diagnosis not present

## 2019-08-23 DIAGNOSIS — M25561 Pain in right knee: Secondary | ICD-10-CM | POA: Diagnosis not present

## 2019-08-23 DIAGNOSIS — Z96642 Presence of left artificial hip joint: Secondary | ICD-10-CM

## 2019-08-23 DIAGNOSIS — S72002D Fracture of unspecified part of neck of left femur, subsequent encounter for closed fracture with routine healing: Secondary | ICD-10-CM

## 2019-08-23 MED ORDER — LIDOCAINE HCL 1 % IJ SOLN
3.0000 mL | INTRAMUSCULAR | Status: AC | PRN
  Administered 2019-08-23: 3 mL

## 2019-08-23 MED ORDER — METHYLPREDNISOLONE ACETATE 40 MG/ML IJ SUSP
40.0000 mg | INTRAMUSCULAR | Status: AC | PRN
  Administered 2019-08-23: 40 mg via INTRA_ARTICULAR

## 2019-08-23 NOTE — Progress Notes (Signed)
Office Visit Note   Patient: Colleen Mclaughlin           Date of Birth: 03/15/1933           MRN: [id number] Visit Date: 08/23/2019              Requested by: Leanna Battles, MD [place],  [place] [address] PCP: Leanna Battles, MD   Assessment & Plan: Visit Diagnoses:  1. Status post total replacement of left hip   2. Closed fracture of neck of left femur with routine healing, subsequent encounter   3. Chronic pain of right knee     Plan: As far as her hip goes, follow-up will be as needed.  I talked about things that need to bring her back for that hip but she is completely pain-free and her x-rays look good so anticipate she should still do well with her left hip.  I did offer a steroid injection in her right knee today and she agrees with this having had it done before.  She tolerated well.  All question concerns were answered and addressed.  Follow-up will be at this point as needed.  Follow-Up Instructions: Return if symptoms worsen or fail to improve.   Orders:  Orders Placed This Encounter  Procedures  . Large Joint Inj  . XR HIP UNILAT W OR W/O PELVIS 1V LEFT   No orders of the defined types were placed in this encounter.     Procedures: Large Joint Inj: R knee on 08/23/2019 12:57 PM Indications: diagnostic evaluation and pain Details: 22 G 1.5 in needle, superolateral approach  Arthrogram: No  Medications: 3 mL lidocaine 1 %; 40 mg methylPREDNISolone acetate 40 MG/ML Outcome: tolerated well, no immediate complications Procedure, treatment alternatives, risks and benefits explained, specific risks discussed. Consent was given by the patient. Immediately prior to procedure a time out was called to verify the correct patient, procedure, equipment, support staff and site/side marked as required. Patient was prepped and draped in the usual sterile fashion.       Clinical Data: No additional findings.   Subjective: Chief Complaint  Patient  presents with  . Left Hip - Follow-up  The patient is a very pleasant 85 year old female who is 11 months out from a left total hip arthroplasty to treat an acute left hip femoral neck fracture.  She does ambulate with a cane but that is due to severe end-stage arthritis of her right knee.  She says her left hip is done wonderful and she has no issues with that at all.  She says her right knee bothers her daily but she is not interested in any type of knee replacement surgery.  She has had injections remotely in that right knee.  She denies any other acute changes in her medical status.  HPI  Review of Systems She currently denies any headache, chest pain, shortness of breath, fever, chills, nausea, vomiting  Objective: Vital Signs: There were no vitals taken for this visit.  Physical Exam She is alert and orient x3 and in no acute distress Ortho Exam Examination of her left hip shows that it moves smoothly and fluidly with no pain at all.  Her ligaments are equal.  Her right knee does show pain throughout its arc of motion. Specialty Comments:  No specialty comments available.  Imaging: Xr Hip Unilat W Or W/o Pelvis 1v Left  Result Date: 08/23/2019 A low AP pelvis and lateral left hip shows a well-seated total  hip arthroplasty with no complicating features.    PMFS History: Patient Active Problem List   Diagnosis Date Noted  . Status post total replacement of left hip 09/26/2018  . Fracture of femoral neck, left ([place]) 09/07/2018   Past Medical History:  Diagnosis Date  . Anemia    x2 years ago took med cleared  . Arthritis    general  . Cancer ([place])    endometrial / complete hyst 2009  . Carpal tunnel syndrome on left   . Hypertension   . Osteopenia     History reviewed. No pertinent family history.  Past Surgical History:  Procedure Laterality Date  . CARPAL TUNNEL RELEASE Right    2012  . CARPAL TUNNEL RELEASE Left 08/29/2018   Procedure: LEFT CARPAL TUNNEL  RELEASE;  Surgeon: Daryll Brod, MD;  Location: [facility];  Service: Orthopedics;  Laterality: Left;  . CATARACT EXTRACTION W/ INTRAOCULAR LENS  IMPLANT, BILATERAL     one approx 5 years ago and the other greater than that but unsure when  . KNEE SURGERY Right    approx 25 years ago "chipped bone"  . RETINAL DETACHMENT SURGERY    . THYROIDECTOMY  1967  . TOTAL HIP ARTHROPLASTY Left 09/08/2018   Procedure: TOTAL HIP ARTHROPLASTY ANTERIOR APPROACH;  Surgeon: Mcarthur Rossetti, MD;  Location: [facility];  Service: Orthopedics;  Laterality: Left;  Marland Kitchen VAGINAL HYSTERECTOMY     2009   Social History   Occupational History  . Occupation: retired  Tobacco Use  . Smoking status: Former Smoker    Quit date: 1996    Years since quitting: 24.9  . Smokeless tobacco: Never Used  Substance and Sexual Activity  . Alcohol use: Never    Frequency: Never  . Drug use: Never  . Sexual activity: Not on file

## 2019-10-15 ENCOUNTER — Ambulatory Visit: Payer: Medicare Other | Attending: Internal Medicine

## 2019-10-15 DIAGNOSIS — Z23 Encounter for immunization: Secondary | ICD-10-CM

## 2019-10-15 NOTE — Progress Notes (Signed)
   Covid-19 Vaccination Clinic  Name:  ANGELEE DREIS    MRN: [id number] DOB: 1932/12/12  10/15/2019  Colleen Mclaughlin was observed post Covid-19 immunization for 15 minutes without incidence. She was provided with Vaccine Information Sheet and instruction to access the V-Safe system.   Colleen Mclaughlin was instructed to call 911 with any severe reactions post vaccine: Marland Kitchen Difficulty breathing  . Swelling of your face and throat  . A fast heartbeat  . A bad rash all over your body  . Dizziness and weakness    Immunizations Administered    Name Date Dose VIS Date Route   Pfizer COVID-19 Vaccine 10/15/2019 11:46 AM 0.3 mL 08/31/2019 Intramuscular   Manufacturer: [place]   Lot: [id number]   [place]: [id number]

## 2019-11-02 ENCOUNTER — Ambulatory Visit: Payer: Medicare Other | Attending: Internal Medicine

## 2019-11-02 DIAGNOSIS — Z23 Encounter for immunization: Secondary | ICD-10-CM | POA: Insufficient documentation

## 2019-11-02 NOTE — Progress Notes (Signed)
   Covid-19 Vaccination Clinic  Name:  LYNASIA WISECARVER    MRN: [id number] DOB: December 07, 1932  11/02/2019  Ms. Rydman was observed post Covid-19 immunization for 15 minutes without incidence. She was provided with Vaccine Information Sheet and instruction to access the V-Safe system.   Ms. Frech was instructed to call 911 with any severe reactions post vaccine: Marland Kitchen Difficulty breathing  . Swelling of your face and throat  . A fast heartbeat  . A bad rash all over your body  . Dizziness and weakness    Immunizations Administered    Name Date Dose VIS Date Route   Pfizer COVID-19 Vaccine 11/02/2019 11:58 AM 0.3 mL 08/31/2019 Intramuscular   Manufacturer: [place]   Lot: [id number]   [place]: [id number]

## 2020-02-27 ENCOUNTER — Ambulatory Visit (INDEPENDENT_AMBULATORY_CARE_PROVIDER_SITE_OTHER): Payer: Medicare Other | Admitting: Ophthalmology

## 2020-02-27 ENCOUNTER — Other Ambulatory Visit: Payer: Self-pay

## 2020-02-27 ENCOUNTER — Encounter (INDEPENDENT_AMBULATORY_CARE_PROVIDER_SITE_OTHER): Payer: Self-pay | Admitting: Ophthalmology

## 2020-02-27 DIAGNOSIS — H353124 Nonexudative age-related macular degeneration, left eye, advanced atrophic with subfoveal involvement: Secondary | ICD-10-CM | POA: Insufficient documentation

## 2020-02-27 DIAGNOSIS — H35361 Drusen (degenerative) of macula, right eye: Secondary | ICD-10-CM | POA: Insufficient documentation

## 2020-02-27 DIAGNOSIS — D3132 Benign neoplasm of left choroid: Secondary | ICD-10-CM

## 2020-02-27 DIAGNOSIS — H353132 Nonexudative age-related macular degeneration, bilateral, intermediate dry stage: Secondary | ICD-10-CM | POA: Diagnosis not present

## 2020-02-27 NOTE — Progress Notes (Signed)
02/27/2020     CHIEF COMPLAINT Patient presents for Retina Follow Up   HISTORY OF PRESENT ILLNESS: Colleen Mclaughlin is a 86 y.o. female who presents to the clinic today for:   HPI    Retina Follow Up    Patient presents with  Dry AMD.  In both eyes.  This started 1 year ago.  Severity is mild.  Duration of 1 year.  Since onset it is gradually worsening.          Comments    1 Year AMD F/U OU  Pt c/o difficulty distinguishing numbers when reading, and sts they run together OU. Pt denies distortion OU. Pt denies ocular pain.       Last edited by Rockie Neighbours, [place] on 02/27/2020 10:10 AM. (History)      Referring physician: Leanna Battles, MD [place],  [place] [address]  HISTORICAL INFORMATION:   Selected notes from the [facility]: No current outpatient medications on file. (Ophthalmic Drugs)   No current facility-administered medications for this visit. (Ophthalmic Drugs)   Current Outpatient Medications (Other)  Medication Sig  . alendronate (FOSAMAX) 70 MG tablet Take 70 mg by mouth once a week. Take with a full glass of water on an empty stomach.  Marland Kitchen aspirin EC 325 MG EC tablet Take 1 tablet (325 mg total) by mouth daily with breakfast.  . atenolol (TENORMIN) 50 MG tablet Take 50 mg by mouth daily.  . calcium carbonate (OS-CAL - DOSED IN MG OF ELEMENTAL CALCIUM) 1250 (500 Ca) MG tablet Take 2 tablets by mouth daily with breakfast.  . cyclobenzaprine (FLEXERIL) 10 MG tablet Take 10 mg by mouth 3 (three) times daily as needed for muscle spasms.   Marland Kitchen levothyroxine (SYNTHROID, LEVOTHROID) 200 MCG tablet Take 200 mcg by mouth daily before breakfast.  . Multiple Vitamin (MULTI-VITAMIN DAILY PO) Take 1 tablet by mouth daily.   . Multiple Vitamins-Minerals (ICAPS AREDS 2 PO) Take 1 capsule by mouth daily.   Marland Kitchen omega-3 fish oil (MAXEPA) 1000 MG CAPS capsule Take 1 capsule by mouth daily.   Marland Kitchen triamterene-hydrochlorothiazide  (MAXZIDE-25) 37.5-25 MG tablet Take 0.5 tablets by mouth daily.   . Vitamin D, Ergocalciferol, (DRISDOL) 1.25 MG (50000 UT) CAPS capsule Take 50,000 Units by mouth every 30 (thirty) days.    No current facility-administered medications for this visit. (Other)      REVIEW OF SYSTEMS:    ALLERGIES Allergies  Allergen Reactions  . Sulfa Antibiotics Anaphylaxis    Hives   . Codeine Other (See Comments)    Severe headaches     PAST MEDICAL HISTORY Past Medical History:  Diagnosis Date  . Anemia    x2 years ago took med cleared  . Arthritis    general  . Cancer ([facility])    endometrial / complete hyst 2009  . Carpal tunnel syndrome on left   . Hypertension   . Osteopenia    Past Surgical History:  Procedure Laterality Date  . CARPAL TUNNEL RELEASE Right    2012  . CARPAL TUNNEL RELEASE Left 08/29/2018   Procedure: LEFT CARPAL TUNNEL RELEASE;  Surgeon: Daryll Brod, MD;  Location: [facility];  Service: Orthopedics;  Laterality: Left;  . CATARACT EXTRACTION W/ INTRAOCULAR LENS  IMPLANT, BILATERAL     one approx 5 years ago and the other greater than that but unsure when  . KNEE SURGERY Right    approx  25 years ago "chipped bone"  . RETINAL DETACHMENT SURGERY    . THYROIDECTOMY  1967  . TOTAL HIP ARTHROPLASTY Left 09/08/2018   Procedure: TOTAL HIP ARTHROPLASTY ANTERIOR APPROACH;  Surgeon: Mcarthur Rossetti, MD;  Location: [facility];  Service: Orthopedics;  Laterality: Left;  Marland Kitchen VAGINAL HYSTERECTOMY     2009    FAMILY HISTORY History reviewed. No pertinent family history.  SOCIAL HISTORY Social History   Tobacco Use  . Smoking status: Former Smoker    Quit date: 1996    Years since quitting: 25.4  . Smokeless tobacco: Never Used  Substance Use Topics  . Alcohol use: Never  . Drug use: Never         OPHTHALMIC EXAM:  Base Eye Exam    Visual Acuity (ETDRS)      Right Left   Dist [place] 20/60 +2 20/100 -3   Dist ph [place] 20/30 -2 20/40 -1  Pt  forgot glasses today       Tonometry (Tonopen, 10:15 AM)      Right Left   Pressure 14 17       Pupils      Pupils Dark Light Shape React APD   Right PERRL 3 2 Round Brisk None   Left PERRL 3 2 Round Brisk None       Visual Fields (Counting fingers)      Left Right    Full Full       Extraocular Movement      Right Left    Full Full       Neuro/Psych    Oriented x3: Yes   Mood/Affect: Normal       Dilation    Both eyes: 1.0% Mydriacyl, 2.5% Phenylephrine @ 10:15 AM        Slit Lamp and Fundus Exam    External Exam      Right Left   External Normal Normal       Slit Lamp Exam      Right Left   Lids/Lashes Normal Normal   Conjunctiva/Sclera White and quiet White and quiet   Cornea Clear Clear   Anterior Chamber Deep and quiet Deep and quiet   Iris Round and reactive Round and reactive   Anterior Vitreous Normal Normal       Fundus Exam      Right Left   Posterior Vitreous Normal Normal   Disc Normal Normal   C/D Ratio 0.35 0.45   Macula Retinal pigment epithelial mottling, Soft drusen, no macular thickening, no hemorrhage Retinal pigment epithelial mottling, Soft drusen, no macular thickening, no hemorrhage   Vessels Normal Normal   Periphery Normal Small choroidal nevus superotemporal quadrant, no subretinal fluid, no atrophy, no lipofuscin, approximately 2 disc diameters in size and flat          IMAGING AND PROCEDURES  Imaging and Procedures for 02/27/20  OCT, Retina - OU - Both Eyes       Right Eye Scan locations included subfoveal. Central Foveal Thickness: 276. Progression has been stable. Findings include abnormal foveal contour, retinal drusen , no SRF, no IRF.   Left Eye Quality was good. Scan locations included subfoveal. Central Foveal Thickness: 277. Progression has been stable. Findings include abnormal foveal contour, retinal drusen , no SRF, no IRF.   Notes No signs of CN VM OU.                 ASSESSMENT/PLAN:  Choroidal nevus of left eye Small, peripheral  in the superotemporal quadrant of the left eye and is stable over time.  No changes  Intermediate stage nonexudative age-related macular degeneration of both eyes The nature of age--related macular degeneration was discussed with the patient as well as the distinction between dry and wet types. Checking an [place]msler Grid daily with advice to return immediately should a distortion develop, was given to the patient. The patient 's smoking status now and in the past was determined and advice based on the AREDS study was provided regarding the consumption of antioxidant supplements. AREDS 2 vitamin formulation was recommended. Consumption of dark leafy vegetables and fresh fruits of various colors was recommended. Treatment modalities for wet macular degeneration particularly the use of intravitreal injections of anti-blood vessel growth factors was discussed with the patient. Avastin, Lucentis, and Eylea are the available options. On occasion, therapy includes the use of photodynamic therapy and thermal laser. Stressed to the patient do not rub eyes. All patient questions were answered.      ICD-10-CM   1. Intermediate stage nonexudative age-related macular degeneration of both eyes  H35.3132 OCT, Retina - OU - Both Eyes  2. Degenerative retinal drusen of right eye  H35.361   3. Choroidal nevus of left eye  D31.32     1.  2.  3.  Ophthalmic Meds Ordered this visit:  No orders of the defined types were placed in this encounter.      Return in about 1 year (around 02/26/2021) for DILATE OU, OCT.  There are no Patient Instructions on file for this visit.   Explained the diagnoses, plan, and follow up with the patient and they expressed understanding.  Patient expressed understanding of the importance of proper follow up care.   Clent Demark Paulette [place]ford M.D. Diseases & Surgery of the Retina and Vitreous [facility] [place] 02/27/20     Abbreviations: M myopia (nearsighted); A astigmatism; H hyperopia (farsighted); P presbyopia; Mrx spectacle prescription;  CTL contact lenses; OD right eye; OS left eye; OU both eyes  XT exotropia; ET esotropia; PEK punctate epithelial keratitis; PEE punctate epithelial erosions; DES dry eye syndrome; MGD meibomian gland dysfunction; ATs artificial tears; PFAT's preservative free artificial tears; [place] nuclear sclerotic cataract; PSC posterior subcapsular cataract; ERM epi-retinal membrane; PVD posterior vitreous detachment; RD retinal detachment; DM diabetes mellitus; DR diabetic retinopathy; NPDR non-proliferative diabetic retinopathy; PDR proliferative diabetic retinopathy; CSME clinically significant macular edema; DME diabetic macular edema; dbh dot blot hemorrhages; CWS cotton wool spot; POAG primary open angle glaucoma; C/D cup-to-disc ratio; HVF humphrey visual field; GVF goldmann visual field; OCT optical coherence tomography; IOP intraocular pressure; BRVO Branch retinal vein occlusion; CRVO central retinal vein occlusion; CRAO central retinal artery occlusion; BRAO branch retinal artery occlusion; RT retinal tear; SB scleral buckle; PPV pars plana vitrectomy; VH Vitreous hemorrhage; PRP panretinal laser photocoagulation; IVK intravitreal kenalog; VMT vitreomacular traction; MH Macular hole;  NVD neovascularization of the disc; NVE neovascularization elsewhere; AREDS age related eye disease study; ARMD age related macular degeneration; POAG primary open angle glaucoma; EBMD epithelial/anterior basement membrane dystrophy; ACIOL anterior chamber intraocular lens; IOL intraocular lens; PCIOL posterior chamber intraocular lens; Phaco/IOL phacoemulsification with intraocular lens placement; [place] photorefractive keratectomy; LASIK laser assisted in situ keratomileusis; HTN hypertension; DM diabetes mellitus; COPD chronic obstructive pulmonary disease

## 2020-02-27 NOTE — Assessment & Plan Note (Signed)
Small, peripheral in the superotemporal quadrant of the left eye and is stable over time.  No changes

## 2020-02-27 NOTE — Assessment & Plan Note (Signed)

## 2020-03-13 IMAGING — CR DG CHEST 1V
2 series · 2 of 2 positions shown · non-contrast
Comparison: None.

CLINICAL DATA: Fall.  Left hip fracture

EXAM:
CHEST  1 VIEW

[x chest ap (1 of 2)]
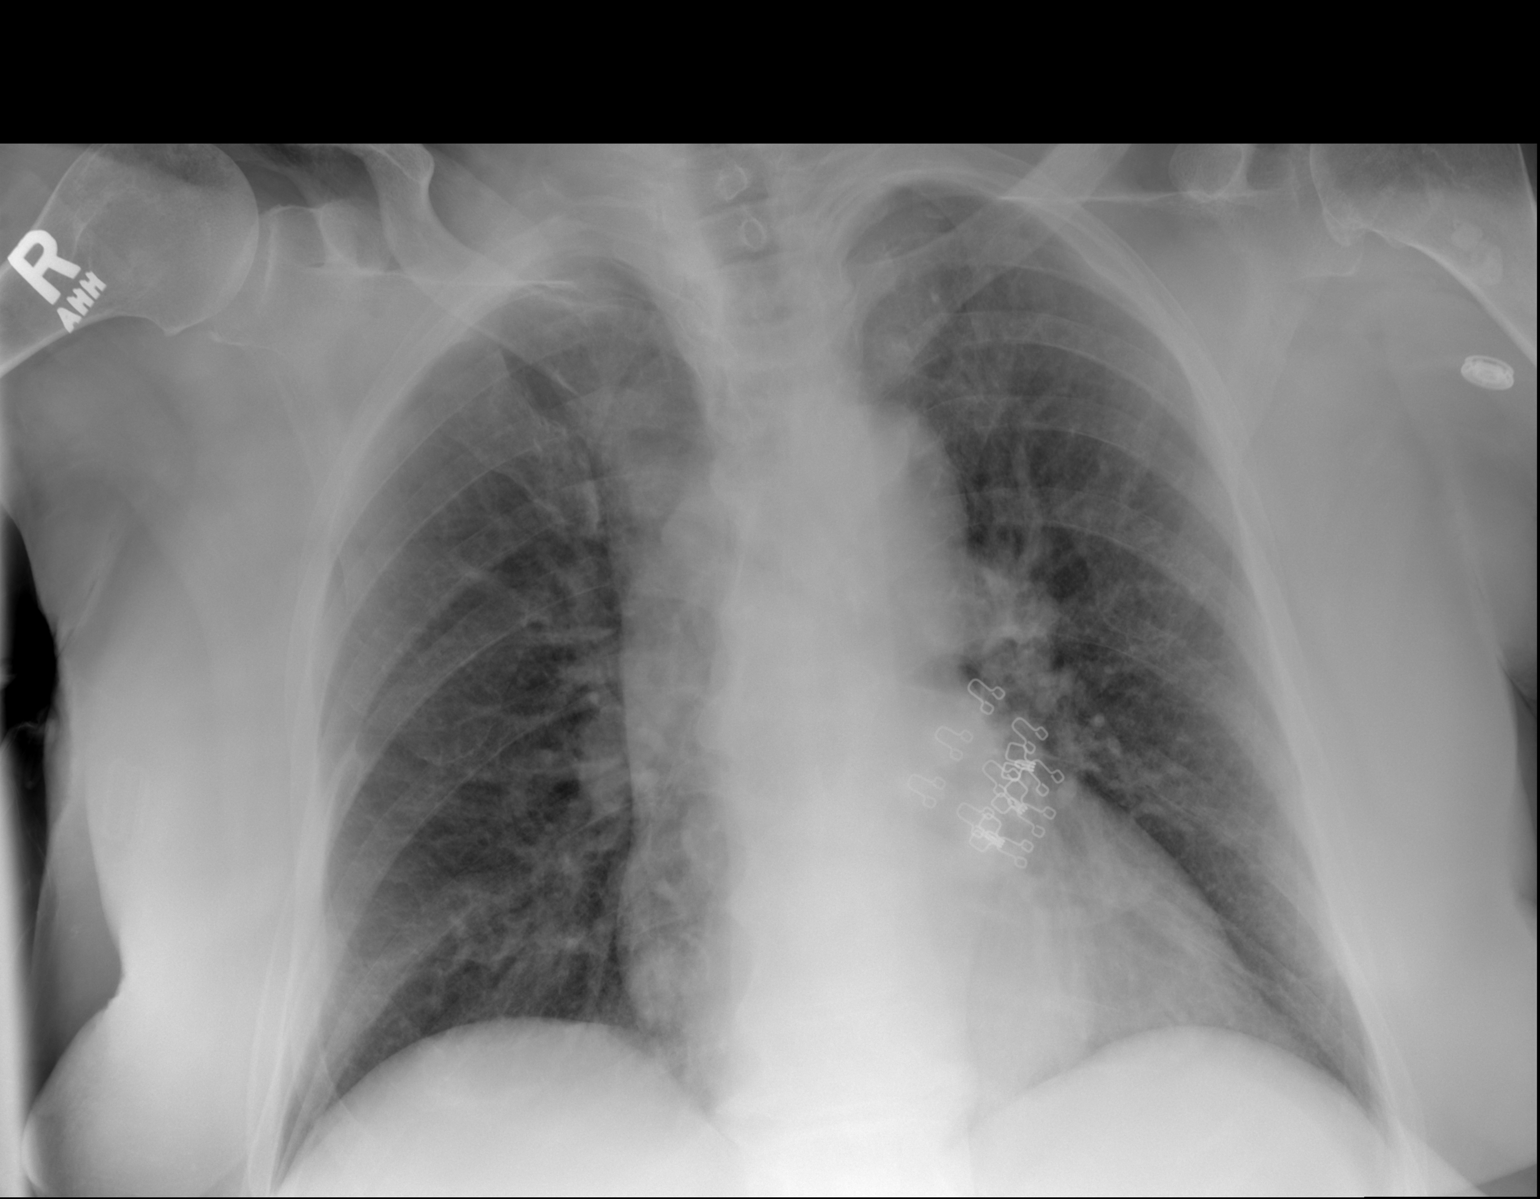

[x chest ap (2 of 2)]
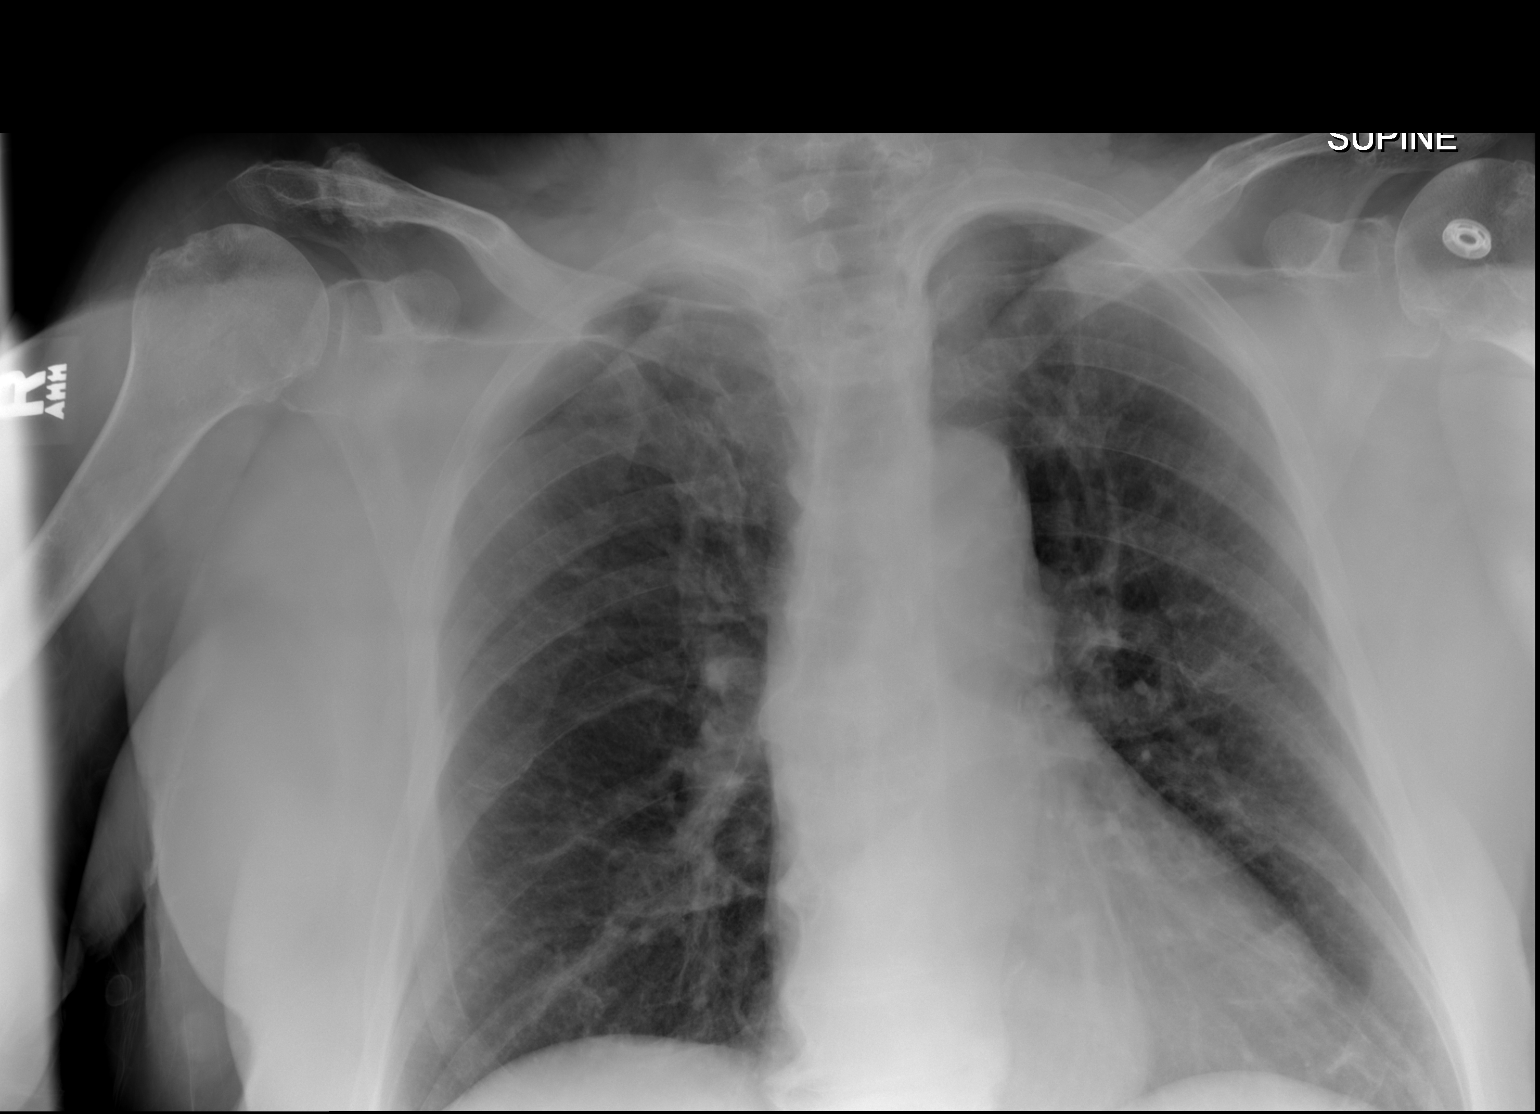

[2 of 2 positions shown; findings below may reference images not displayed]

FINDINGS: Heart size upper normal. Negative for heart failure. Negative for
infiltrate or effusion.
IMPRESSION: No active disease.

## 2020-06-10 ENCOUNTER — Ambulatory Visit (HOSPITAL_COMMUNITY)
Admission: RE | Admit: 2020-06-10 | Discharge: 2020-06-10 | Disposition: A | Discharge status: Home / Self Care | Payer: Medicare Other | Source: Ambulatory Visit | Attending: Internal Medicine | Admitting: Internal Medicine

## 2020-06-10 ENCOUNTER — Other Ambulatory Visit: Payer: Self-pay

## 2020-06-10 ENCOUNTER — Other Ambulatory Visit (HOSPITAL_COMMUNITY): Payer: Self-pay | Admitting: Internal Medicine

## 2020-06-10 DIAGNOSIS — M79604 Pain in right leg: Secondary | ICD-10-CM | POA: Diagnosis not present

## 2020-06-10 DIAGNOSIS — R600 Localized edema: Secondary | ICD-10-CM | POA: Insufficient documentation

## 2020-11-24 ENCOUNTER — Telehealth: Payer: Self-pay

## 2020-11-24 ENCOUNTER — Ambulatory Visit: Payer: Medicare Other | Admitting: Orthopaedic Surgery

## 2020-11-24 ENCOUNTER — Encounter: Payer: Self-pay | Admitting: Orthopaedic Surgery

## 2020-11-24 DIAGNOSIS — M25561 Pain in right knee: Secondary | ICD-10-CM

## 2020-11-24 DIAGNOSIS — M1711 Unilateral primary osteoarthritis, right knee: Secondary | ICD-10-CM | POA: Diagnosis not present

## 2020-11-24 DIAGNOSIS — G8929 Other chronic pain: Secondary | ICD-10-CM | POA: Diagnosis not present

## 2020-11-24 NOTE — Telephone Encounter (Signed)
Noted  

## 2020-11-24 NOTE — Progress Notes (Signed)
Office Visit Note   Patient: Colleen Mclaughlin           Date of Birth: Oct 18, 1932           MRN: [id number] Visit Date: 11/24/2020              Requested by: Leanna Battles, MD [place],  [place] [address] PCP: Leanna Battles, MD   Assessment & Plan: Visit Diagnoses:  1. Chronic pain of right knee   2. Unilateral primary osteoarthritis, right knee     Plan: Given the failure of all conservative treatment measures for her right knee including steroid injections that have not worked, the next step in treating osteoarthritis of the right knee would be considering hyaluronic acid as a supplement injection for the right knee.  I explained in detail what this involves and she is interested in ice ordering this for her right knee to treat the pain from osteoarthritis of that knee.  X-rays of the knee in the past showing significant joint space narrowing with sclerotic changes and peritracheal osteophytes.  Other conservative treatments such as anti-inflammatories and even offloading the knee with a cane are no longer providing any significant health for her right knee so the next obvious treatment step would be considering hyaluronic acid.  We will order that for her knee and be in touch when that is approved and comes in.  All question concerns were answered and addressed.  Follow-Up Instructions: No follow-ups on file.   Orders:  No orders of the defined types were placed in this encounter.  No orders of the defined types were placed in this encounter.     Procedures: No procedures performed   Clinical Data: No additional findings.   Subjective: Chief Complaint  Patient presents with  . Right Knee - Follow-up  The patient comes in today with continued chronic right knee pain.  She is 87 years old.  She has gone to the point where steroid injections have not helped at all for that knee.  She has a remote history of knee arthroscopic surgery many years ago.  She  has known osteoarthritis of the right knee.  She is interested in considering hyaluronic acid injections at this point given the failure of conservative treatment including the failure of steroid injections.  HPI  Review of Systems She currently denies any headache, chest pain, shortness of breath, fever, chills, nausea, vomiting  Objective: Vital Signs: There were no vitals taken for this visit.  Physical Exam She is alert and orient x3 and in no acute distress Ortho Exam Examination of her right knee shows some slight varus malalignment.  There is significant medial joint line tenderness as well as patellofemoral crepitation.  The knee is ligamentously stable. Specialty Comments:  No specialty comments available.  Imaging: No results found.   PMFS History: Patient Active Problem List   Diagnosis Date Noted  . Unilateral primary osteoarthritis, right knee 11/24/2020  . Intermediate stage nonexudative age-related macular degeneration of both eyes 02/27/2020  . Degenerative retinal drusen of right eye 02/27/2020  . Choroidal nevus of left eye 02/27/2020  . Status post total replacement of left hip 09/26/2018  . Fracture of femoral neck, left ([place]) 09/07/2018   Past Medical History:  Diagnosis Date  . Anemia    x2 years ago took med cleared  . Arthritis    general  . Cancer ([facility])    endometrial / complete hyst 2009  . Carpal tunnel syndrome on left   .  Hypertension   . Osteopenia     History reviewed. No pertinent family history.  Past Surgical History:  Procedure Laterality Date  . CARPAL TUNNEL RELEASE Right    2012  . CARPAL TUNNEL RELEASE Left 08/29/2018   Procedure: LEFT CARPAL TUNNEL RELEASE;  Surgeon: Daryll Brod, MD;  Location: [facility];  Service: Orthopedics;  Laterality: Left;  . CATARACT EXTRACTION W/ INTRAOCULAR LENS  IMPLANT, BILATERAL     one approx 5 years ago and the other greater than that but unsure when  . KNEE SURGERY Right     approx 25 years ago "chipped bone"  . RETINAL DETACHMENT SURGERY    . THYROIDECTOMY  1967  . TOTAL HIP ARTHROPLASTY Left 09/08/2018   Procedure: TOTAL HIP ARTHROPLASTY ANTERIOR APPROACH;  Surgeon: Mcarthur Rossetti, MD;  [facility];  Service: Orthopedics;  Laterality: Left;  Marland Kitchen VAGINAL HYSTERECTOMY     2009   Social History   Occupational History  . Occupation: retired  Tobacco Use  . Smoking status: Former Smoker    Quit date: 1996    Years since quitting: 26.1  . Smokeless tobacco: Never Used  Vaping Use  . Vaping Use: Never used  Substance and Sexual Activity  . Alcohol use: Never  . Drug use: Never  . Sexual activity: Not on file

## 2020-11-24 NOTE — Telephone Encounter (Signed)
Right knee gel injection  

## 2020-11-25 ENCOUNTER — Telehealth: Payer: Self-pay

## 2020-11-25 NOTE — Telephone Encounter (Signed)
Submitted VOB for Durolane, right knee. Pending BV. 

## 2020-12-15 ENCOUNTER — Telehealth: Payer: Self-pay

## 2020-12-15 NOTE — Telephone Encounter (Signed)
Approved for Durolane, right knee. [place] Patient will be responsible for 20% OOP. Co-pay of $35.00 No PA required

## 2020-12-17 ENCOUNTER — Ambulatory Visit: Payer: Medicare Other | Admitting: Orthopaedic Surgery

## 2020-12-17 ENCOUNTER — Encounter: Payer: Self-pay | Admitting: Orthopaedic Surgery

## 2020-12-17 DIAGNOSIS — M1711 Unilateral primary osteoarthritis, right knee: Secondary | ICD-10-CM

## 2020-12-17 MED ORDER — SODIUM HYALURONATE 60 MG/3ML IX PRSY
60.0000 mg | PREFILLED_SYRINGE | INTRA_ARTICULAR | Status: AC | PRN
  Administered 2020-12-17: 60 mg via INTRA_ARTICULAR

## 2020-12-17 NOTE — Progress Notes (Signed)
   Procedure Note  Patient: Colleen Mclaughlin             Date of Birth: 01-Nov-1932           MRN: [id number]             Visit Date: 12/17/2020  Procedures: Visit Diagnoses:  1. Unilateral primary osteoarthritis, right knee     Large Joint Inj: R knee on 12/17/2020 2:34 PM Indications: diagnostic evaluation and pain Details: 22 G 1.5 in needle, superolateral approach  Arthrogram: No  Medications: 60 mg Sodium Hyaluronate 60 MG/3ML Outcome: tolerated well, no immediate complications Procedure, treatment alternatives, risks and benefits explained, specific risks discussed. Consent was given by the patient. Immediately prior to procedure a time out was called to verify the correct patient, procedure, equipment, support staff and site/side marked as required. Patient was prepped and draped in the usual sterile fashion.    The patient comes in today for a hyaluronic acid injection in her right knee with Durolane to treat the pain from osteoarthritis.  She has tried and failed other forms of conservative treatment including steroid injections.  She does ambulate with a cane.  She says she last had gel injections about 15 years ago.  She has had no other acute change in medical status.  It was a little difficult getting the Durolane injection in her knee through the superior lateral aspect.  Afterwards she was able to walk.  All questions and concerns were answered and addressed.  Follow-up can be as needed.  We can always repeat a steroid injection in 3 months if needed.

## 2021-02-26 ENCOUNTER — Encounter (INDEPENDENT_AMBULATORY_CARE_PROVIDER_SITE_OTHER): Payer: Self-pay | Admitting: Ophthalmology

## 2021-02-26 ENCOUNTER — Ambulatory Visit (INDEPENDENT_AMBULATORY_CARE_PROVIDER_SITE_OTHER): Payer: Medicare Other | Admitting: Ophthalmology

## 2021-02-26 ENCOUNTER — Other Ambulatory Visit: Payer: Self-pay

## 2021-02-26 DIAGNOSIS — H353132 Nonexudative age-related macular degeneration, bilateral, intermediate dry stage: Secondary | ICD-10-CM | POA: Diagnosis not present

## 2021-02-26 DIAGNOSIS — D3132 Benign neoplasm of left choroid: Secondary | ICD-10-CM

## 2021-02-26 NOTE — Progress Notes (Signed)
02/26/2021     CHIEF COMPLAINT Patient presents for Retina Follow Up (1 year fu ou and oct/Pt states, "My va seems to be ok but I am having more trouble distinguishing round letters and numbers. I do notice that when I bend over my vision has a blue hue."/Pt denies any FOL, Floaters or other visual disturbances. /)   HISTORY OF PRESENT ILLNESS: Colleen Mclaughlin is a 87 y.o. female who presents to the clinic today for:   HPI     Retina Follow Up           Diagnosis: Dry AMD   Laterality: both eyes   Onset: 1 year ago   Severity: mild   Duration: 1 year   Course: stable   Comments: 1 year fu ou and oct Pt states, "My va seems to be ok but I am having more trouble distinguishing round letters and numbers. I do notice that when I bend over my vision has a blue hue." Pt denies any FOL, Floaters or other visual disturbances.         Last edited by Kendra Opitz, COA on 02/26/2021 10:42 AM.      Referring physician: Leanna Battles, MD [place],  [place] [id number]  HISTORICAL INFORMATION:   Selected notes from the [facility]: No current outpatient medications on file. (Ophthalmic Drugs)   No current facility-administered medications for this visit. (Ophthalmic Drugs)   Current Outpatient Medications (Other)  Medication Sig   alendronate (FOSAMAX) 70 MG tablet Take 70 mg by mouth once a week. Take with a full glass of water on an empty stomach.   aspirin EC 325 MG EC tablet Take 1 tablet (325 mg total) by mouth daily with breakfast.   atenolol (TENORMIN) 50 MG tablet Take 50 mg by mouth daily.   calcium carbonate (OS-CAL - DOSED IN MG OF ELEMENTAL CALCIUM) 1250 (500 Ca) MG tablet Take 2 tablets by mouth daily with breakfast.   cyclobenzaprine (FLEXERIL) 10 MG tablet Take 10 mg by mouth 3 (three) times daily as needed for muscle spasms.    levothyroxine (SYNTHROID, LEVOTHROID) 200 MCG tablet Take 200 mcg by mouth daily  before breakfast.   Multiple Vitamin (MULTI-VITAMIN DAILY PO) Take 1 tablet by mouth daily.    Multiple Vitamins-Minerals (ICAPS AREDS 2 PO) Take 1 capsule by mouth daily.    omega-3 fish oil (MAXEPA) 1000 MG CAPS capsule Take 1 capsule by mouth daily.    triamterene-hydrochlorothiazide (MAXZIDE-25) 37.5-25 MG tablet Take 0.5 tablets by mouth daily.    Vitamin D, Ergocalciferol, (DRISDOL) 1.25 MG (50000 UT) CAPS capsule Take 50,000 Units by mouth every 30 (thirty) days.    No current facility-administered medications for this visit. (Other)      REVIEW OF SYSTEMS:    ALLERGIES Allergies  Allergen Reactions   Sulfa Antibiotics Anaphylaxis    Hives    Codeine Other (See Comments)    Severe headaches     PAST MEDICAL HISTORY Past Medical History:  Diagnosis Date   Anemia    x2 years ago took med cleared   Arthritis    general   Cancer ([place])    endometrial / complete hyst 2009   Carpal tunnel syndrome on left    Hypertension    Osteopenia    Past Surgical History:  Procedure Laterality Date   CARPAL TUNNEL RELEASE Right    2012   CARPAL TUNNEL RELEASE  Left 08/29/2018   Procedure: LEFT CARPAL TUNNEL RELEASE;  Surgeon: Daryll Brod, MD;  Location: [facility];  Service: Orthopedics;  Laterality: Left;   CATARACT EXTRACTION W/ INTRAOCULAR LENS  IMPLANT, BILATERAL     one approx 5 years ago and the other greater than that but unsure when   KNEE SURGERY Right    approx 25 years ago "chipped bone"   [place] HIP ARTHROPLASTY Left 09/08/2018   Procedure: TOTAL HIP ARTHROPLASTY ANTERIOR APPROACH;  Surgeon: Mcarthur Rossetti, MD;  Location: [facility];  Service: Orthopedics;  Laterality: Left;   VAGINAL HYSTERECTOMY     2009    FAMILY HISTORY History reviewed. No pertinent family history.  SOCIAL HISTORY Social History   Tobacco Use   Smoking status: Former    Pack years: 0.00    Types: Cigarettes     Quit date: 1996    Years since quitting: 26.4   Smokeless tobacco: Never  Vaping Use   Vaping Use: Never used  Substance Use Topics   Alcohol use: Never   Drug use: Never         OPHTHALMIC EXAM:  Base Eye Exam     Visual Acuity (ETDRS)       Right Left   Dist [place] 20/40 -2 20/100   Dist ph [place] 20/25 20/50 -1    Correction: Glasses         Tonometry (Tonopen, 10:46 AM)       Right Left   Pressure 14 16         Pupils       Pupils Dark Light Shape React APD   Right PERRL 3 2 Round Brisk None   Left PERRL 3 2 Round Brisk None         Visual Fields (Counting fingers)       Left Right    Full Full         Extraocular Movement       Right Left    Full Full         Neuro/Psych     Oriented x3: Yes   Mood/Affect: Normal         Dilation     Both eyes: 1.0% Mydriacyl, 2.5% Phenylephrine @ 10:46 AM           Slit Lamp and Fundus Exam     External Exam       Right Left   External Normal Normal         Slit Lamp Exam       Right Left   Lids/Lashes Normal Normal   Conjunctiva/Sclera White and quiet White and quiet   Cornea Clear Clear   Anterior Chamber Deep and quiet Deep and quiet   Iris Round and reactive Round and reactive   Lens Posterior chamber intraocular lens, Open posterior capsule Posterior chamber intraocular lens, Open posterior capsule   Anterior Vitreous Normal Normal         Fundus Exam       Right Left   Posterior Vitreous Posterior vitreous detachment Posterior vitreous detachment   Disc Normal Normal   C/D Ratio 0.35 0.55   Macula Retinal pigment epithelial mottling, Soft drusen, no macular thickening, no hemorrhage, Hard drusen Retinal pigment epithelial mottling, Soft drusen, no macular thickening, no hemorrhage   Vessels Normal Normal   Periphery Normal Small choroidal nevus superotemporal quadrant, no subretinal fluid, no atrophy,  no lipofuscin, approximately 2 disc diameters in size and flat             IMAGING AND PROCEDURES  Imaging and Procedures for 02/26/21  OCT, Retina - OU - Both Eyes       Right Eye Scan locations included subfoveal. Central Foveal Thickness: 270. Progression has been stable. Findings include abnormal foveal contour, retinal drusen , no SRF, no IRF.   Left Eye Quality was good. Scan locations included subfoveal. Central Foveal Thickness: 288. Progression has been stable. Findings include abnormal foveal contour, retinal drusen , no SRF, no IRF.   Notes No signs of CN VM OU.  OS pigmentary infiltration into the outer retinal layers stable and similar Today as compared to 1 year ago              ASSESSMENT/PLAN:  Intermediate stage nonexudative age-related macular degeneration of both eyes No interval change, observe OU no signs of CNVM  Choroidal nevus of left eye Small no interval change, low risk,  no high risk features     ICD-10-CM   1. Intermediate stage nonexudative age-related macular degeneration of both eyes  H35.3132 OCT, Retina - OU - Both Eyes    2. Choroidal nevus of left eye  D31.32       1.  ARMD OU, overall stable, no signs of CNVM will observe  2.  Small nevus choroidal left eye notes interval change will observe  3.  Dilate OU next, color fundus photography and OCT  Ophthalmic Meds Ordered this visit:  No orders of the defined types were placed in this encounter.      Return in about 6 months (around 08/28/2021) for COLOR FP, OCT, DILATE OU.  Patient Instructions  Age-Related Macular Degeneration  Age-related macular degeneration (AMD) is an eye disease related to aging. The disease causes a loss of central vision. Central vision allows a person to see objects clearly and do daily tasks like reading and driving. There are two main types of AMD: Dry AMD. People with this type generally lose their vision slowly. This is the most common type of AMD. Some people with dry AMD notice very little change in  their vision as they age. Wet AMD. People with this type can lose their vision quickly. What are the causes? This condition is caused by damage to the part of the eye that provides you with central vision (macula). Dry AMD happens when deposits in the macula cause light-sensitive cells to slowly break down. Wet AMD happens when abnormal blood vessels grow under the macula and leak blood and fluid. What increases the risk? You are more likely to develop this condition if you: Are 39 years old or older, and especially 82 years old or older. Smoke. Are obese. Have a family history of AMD. Have high cholesterol, high blood pressure, or heart disease. Have been exposed to high levels of ultraviolet (UV) light and blue light. Are white (Caucasian). Are female. What are the signs or symptoms? Common symptoms of this condition include: Blurred vision, especially when reading print material. The blurred vision often improves in brighter light. A blurred or blind spot in the center of your field of vision that is small but growing larger. Bright colors seeming less bright than they used to be. Decreased ability to recognize and see faces. One eye seeing worse than the other. Decreased ability to adapt to dimly lit rooms. Straight lines appearing crooked or wavy. How is this diagnosed? This condition is  diagnosed based on your symptoms and an eye exam. During the eye exam: Eye drops will be placed into your eyes to enlarge (dilate) your pupils. This will allow your health care provider to see the back of your eye. You may be asked to look at an image that looks like a checkerboard (Amsler grid). Early changes in your central vision may cause the grid to appear distorted. After the exam, you may be given one or both of these tests: Fluorescein angiogram. This test determines whether you have dry or wet AMD. Optical coherence tomography (OCT) test to evaluate deep layers of the retina. How is this  treated? There is no cure for this condition, but treatment can help to slow down progression of the disease. This condition may be treated with: Supplements, including vitamin C, vitamin E, beta carotene, and zinc. Laser surgery to destroy new blood vessels or leaking blood vessels in your eye. Injections of medicines into your eye to slow down the formation of abnormal blood vessels that may leak. These injections may need to be repeated on a routine basis. Follow these instructions at home: Take over-the-counter and prescription medicines only as told by your health care provider. Take vitamins and supplements as told by your health care provider. Ask your health care provider for an Amsler grid. Use it every day to check each eye for vision changes. Get an eye exam as often as told by your health care provider. Make sure to get an eye exam at least once every year. Keep all follow-up visits as told by your health care provider. This is important. Contact a health care provider if: You notice any new changes in your vision. Get help right away if: You suddenly lose vision or develop pain in the eye. Summary Age-related macular degeneration (AMD) is an eye disease related to aging. There are two types of this condition: dry AMD and wet AMD. This condition is caused by damage to the part of the eye that provides you with central vision (macula). Once diagnosed with AMD, make sure to get an eye exam every year, take supplements and vitamins as directed, use an Amsler grid at home, and follow up with your health care provider. This information is not intended to replace advice given to you by your health care provider. Make sure you discuss any questions you have with your health care provider. Document Revised: 03/15/2018 Document Reviewed: 03/15/2018 Elsevier Patient Education  2021 [place] the diagnoses, plan, and follow up with the patient and they expressed  understanding.  Patient expressed understanding of the importance of proper follow up care.   Clent Demark Smantha Boakye M.D. Diseases & Surgery of the Retina and Vitreous [facility] [place] 02/26/21     Abbreviations: M myopia (nearsighted); A astigmatism; H hyperopia (farsighted); P presbyopia; Mrx spectacle prescription;  CTL contact lenses; OD right eye; OS left eye; OU both eyes  XT exotropia; ET esotropia; PEK punctate epithelial keratitis; PEE punctate epithelial erosions; DES dry eye syndrome; MGD meibomian gland dysfunction; ATs artificial tears; PFAT's preservative free artificial tears; [place] nuclear sclerotic cataract; PSC posterior subcapsular cataract; ERM epi-retinal membrane; PVD posterior vitreous detachment; RD retinal detachment; DM diabetes mellitus; DR diabetic retinopathy; NPDR non-proliferative diabetic retinopathy; PDR proliferative diabetic retinopathy; CSME clinically significant macular edema; DME diabetic macular edema; dbh dot blot hemorrhages; CWS cotton wool spot; POAG primary open angle glaucoma; C/D cup-to-disc ratio; HVF humphrey visual field; GVF goldmann visual field; OCT optical coherence  tomography; IOP intraocular pressure; BRVO Branch retinal vein occlusion; CRVO central retinal vein occlusion; CRAO central retinal artery occlusion; BRAO branch retinal artery occlusion; RT retinal tear; SB scleral buckle; PPV pars plana vitrectomy; VH Vitreous hemorrhage; PRP panretinal laser photocoagulation; IVK intravitreal kenalog; VMT vitreomacular traction; MH Macular hole;  NVD neovascularization of the disc; NVE neovascularization elsewhere; AREDS age related eye disease study; ARMD age related macular degeneration; POAG primary open angle glaucoma; EBMD epithelial/anterior basement membrane dystrophy; ACIOL anterior chamber intraocular lens; IOL intraocular lens; PCIOL posterior chamber intraocular lens; Phaco/IOL phacoemulsification with intraocular lens placement; [place]  photorefractive keratectomy; LASIK laser assisted in situ keratomileusis; HTN hypertension; DM diabetes mellitus; COPD chronic obstructive pulmonary disease

## 2021-02-26 NOTE — Assessment & Plan Note (Signed)
No interval change, observe OU no signs of CNVM

## 2021-02-26 NOTE — Patient Instructions (Signed)
Age-Related Macular Degeneration  Age-related macular degeneration (AMD) is an eye disease related to aging. The disease causes a loss of central vision. Central vision allows a person to see objects clearly and do daily tasks like reading and driving. There are two main types of AMD: Dry AMD. People with this type generally lose their vision slowly. This is the most common type of AMD. Some people with dry AMD notice very little change in their vision as they age. Wet AMD. People with this type can lose their vision quickly. What are the causes? This condition is caused by damage to the part of the eye that provides you with central vision (macula). Dry AMD happens when deposits in the macula cause light-sensitive cells to slowly break down. Wet AMD happens when abnormal blood vessels grow under the macula and leak blood and fluid. What increases the risk? You are more likely to develop this condition if you: Are 54 years old or older, and especially 14 years old or older. Smoke. Are obese. Have a family history of AMD. Have high cholesterol, high blood pressure, or heart disease. Have been exposed to high levels of ultraviolet (UV) light and blue light. Are white (Caucasian). Are female. What are the signs or symptoms? Common symptoms of this condition include: Blurred vision, especially when reading print material. The blurred vision often improves in brighter light. A blurred or blind spot in the center of your field of vision that is small but growing larger. Bright colors seeming less bright than they used to be. Decreased ability to recognize and see faces. One eye seeing worse than the other. Decreased ability to adapt to dimly lit rooms. Straight lines appearing crooked or wavy. How is this diagnosed? This condition is diagnosed based on your symptoms and an eye exam. During the eye exam: Eye drops will be placed into your eyes to enlarge (dilate) your pupils. This will allow  your health care provider to see the back of your eye. You may be asked to look at an image that looks like a checkerboard (Amsler grid). Early changes in your central vision may cause the grid to appear distorted. After the exam, you may be given one or both of these tests: Fluorescein angiogram. This test determines whether you have dry or wet AMD. Optical coherence tomography (OCT) test to evaluate deep layers of the retina. How is this treated? There is no cure for this condition, but treatment can help to slow down progression of the disease. This condition may be treated with: Supplements, including vitamin C, vitamin E, beta carotene, and zinc. Laser surgery to destroy new blood vessels or leaking blood vessels in your eye. Injections of medicines into your eye to slow down the formation of abnormal blood vessels that may leak. These injections may need to be repeated on a routine basis. Follow these instructions at home: Take over-the-counter and prescription medicines only as told by your health care provider. Take vitamins and supplements as told by your health care provider. Ask your health care provider for an Amsler grid. Use it every day to check each eye for vision changes. Get an eye exam as often as told by your health care provider. Make sure to get an eye exam at least once every year. Keep all follow-up visits as told by your health care provider. This is important. Contact a health care provider if: You notice any new changes in your vision. Get help right away if: You suddenly lose vision or  develop pain in the eye. Summary Age-related macular degeneration (AMD) is an eye disease related to aging. There are two types of this condition: dry AMD and wet AMD. This condition is caused by damage to the part of the eye that provides you with central vision (macula). Once diagnosed with AMD, make sure to get an eye exam every year, take supplements and vitamins as directed, use  an Amsler grid at home, and follow up with your health care provider. This information is not intended to replace advice given to you by your health care provider. Make sure you discuss any questions you have with your health care provider. Document Revised: 03/15/2018 Document Reviewed: 03/15/2018 Elsevier Patient Education  [place].

## 2021-02-26 NOTE — Assessment & Plan Note (Signed)
Small no interval change, low risk,  no high risk features

## 2021-07-20 ENCOUNTER — Encounter (INDEPENDENT_AMBULATORY_CARE_PROVIDER_SITE_OTHER): Payer: Self-pay

## 2021-09-03 ENCOUNTER — Other Ambulatory Visit: Payer: Self-pay

## 2021-09-03 ENCOUNTER — Ambulatory Visit (INDEPENDENT_AMBULATORY_CARE_PROVIDER_SITE_OTHER): Payer: Medicare Other | Admitting: Ophthalmology

## 2021-09-03 ENCOUNTER — Encounter (INDEPENDENT_AMBULATORY_CARE_PROVIDER_SITE_OTHER): Payer: Self-pay | Admitting: Ophthalmology

## 2021-09-03 DIAGNOSIS — H353112 Nonexudative age-related macular degeneration, right eye, intermediate dry stage: Secondary | ICD-10-CM | POA: Diagnosis not present

## 2021-09-03 DIAGNOSIS — H353132 Nonexudative age-related macular degeneration, bilateral, intermediate dry stage: Secondary | ICD-10-CM

## 2021-09-03 DIAGNOSIS — H353124 Nonexudative age-related macular degeneration, left eye, advanced atrophic with subfoveal involvement: Secondary | ICD-10-CM | POA: Diagnosis not present

## 2021-09-03 NOTE — Assessment & Plan Note (Signed)
OD with retained good acuity no sign of CNVM

## 2021-09-03 NOTE — Progress Notes (Signed)
09/03/2021     CHIEF COMPLAINT Patient presents for  Chief Complaint  Patient presents with   Retina Follow Up    1 year fu ou and oct Pt states, "My va seems to be ok but I am having more trouble distinguishing round letters and numbers. I do notice that when I bend over my vision has a blue hue." Pt denies any FOL, Floaters or other visual disturbances.        HISTORY OF PRESENT ILLNESS: Colleen Mclaughlin is a 87 y.o. female who presents to the clinic today for:   HPI     Retina Follow Up           Diagnosis: Dry AMD   Laterality: both eyes   Onset: 6 months ago   Severity: mild   Duration: 6 months   Course: stable   Comments: 1 year fu ou and oct Pt states, "My va seems to be ok but I am having more trouble distinguishing round letters and numbers. I do notice that when I bend over my vision has a blue hue." Pt denies any FOL, Floaters or other visual disturbances.           Comments   6 mos fu OU OCT FP. Pt states "I have been going to Dr. Herbert Deaner, I put steroid drops in my left eye for the pressure, it went from 30 to 17." Pt states she does not notice any change in her vision.  Denies new FOL or floaters.      Last edited by Laurin Coder on 09/03/2021 10:33 AM.      Referring physician: Leanna Battles, MD [place],  [place] [address]  HISTORICAL INFORMATION:   Selected notes from the [facility]: No current outpatient medications on file. (Ophthalmic Drugs)   No current facility-administered medications for this visit. (Ophthalmic Drugs)   Current Outpatient Medications (Other)  Medication Sig   alendronate (FOSAMAX) 70 MG tablet Take 70 mg by mouth once a week. Take with a full glass of water on an empty stomach.   aspirin EC 325 MG EC tablet Take 1 tablet (325 mg total) by mouth daily with breakfast.   atenolol (TENORMIN) 50 MG tablet Take 50 mg by mouth daily.   calcium carbonate  (OS-CAL - DOSED IN MG OF ELEMENTAL CALCIUM) 1250 (500 Ca) MG tablet Take 2 tablets by mouth daily with breakfast.   cyclobenzaprine (FLEXERIL) 10 MG tablet Take 10 mg by mouth 3 (three) times daily as needed for muscle spasms.    levothyroxine (SYNTHROID, LEVOTHROID) 200 MCG tablet Take 200 mcg by mouth daily before breakfast.   Multiple Vitamin (MULTI-VITAMIN DAILY PO) Take 1 tablet by mouth daily.    Multiple Vitamins-Minerals (ICAPS AREDS 2 PO) Take 1 capsule by mouth daily.    omega-3 fish oil (MAXEPA) 1000 MG CAPS capsule Take 1 capsule by mouth daily.    triamterene-hydrochlorothiazide (MAXZIDE-25) 37.5-25 MG tablet Take 0.5 tablets by mouth daily.    Vitamin D, Ergocalciferol, (DRISDOL) 1.25 MG (50000 UT) CAPS capsule Take 50,000 Units by mouth every 30 (thirty) days.    No current facility-administered medications for this visit. (Other)      REVIEW OF SYSTEMS:    ALLERGIES Allergies  Allergen Reactions   Sulfa Antibiotics Anaphylaxis    Hives    Codeine Other (See Comments)    Severe headaches     PAST MEDICAL HISTORY Past  Medical History:  Diagnosis Date   Anemia    x2 years ago took med cleared   Arthritis    general   Cancer ([place])    endometrial / complete hyst 2009   Carpal tunnel syndrome on left    Hypertension    Osteopenia    Past Surgical History:  Procedure Laterality Date   CARPAL TUNNEL RELEASE Right    2012   CARPAL TUNNEL RELEASE Left 08/29/2018   Procedure: LEFT CARPAL TUNNEL RELEASE;  Surgeon: Daryll Brod, MD;  Location: [facility];  Service: Orthopedics;  Laterality: Left;   CATARACT EXTRACTION W/ INTRAOCULAR LENS  IMPLANT, BILATERAL     one approx 5 years ago and the other greater than that but unsure when   KNEE SURGERY Right    approx 25 years ago "chipped bone"   [place] HIP ARTHROPLASTY Left 09/08/2018   Procedure: TOTAL HIP ARTHROPLASTY ANTERIOR APPROACH;  Surgeon:  Mcarthur Rossetti, MD;  Location: [facility];  Service: Orthopedics;  Laterality: Left;   VAGINAL HYSTERECTOMY     2009    FAMILY HISTORY History reviewed. No pertinent family history.  SOCIAL HISTORY Social History   Tobacco Use   Smoking status: Former    Types: Cigarettes    Quit date: 1996    Years since quitting: 26.9   Smokeless tobacco: Never  Vaping Use   Vaping Use: Never used  Substance Use Topics   Alcohol use: Never   Drug use: Never         OPHTHALMIC EXAM:  Base Eye Exam     Visual Acuity (ETDRS)       Right Left   Dist [place] 20/40 -1 20/100 -1   Dist ph [place] 20/25 20/50         Tonometry (Tonopen, 10:39 AM)       Right Left   Pressure 16 13         Pupils       Pupils Dark Light APD   Right PERRL 5 4 None   Left PERRL 3 2 None         Extraocular Movement       Right Left    Full Full         Neuro/Psych     Oriented x3: Yes   Mood/Affect: Normal         Dilation     Both eyes: 1.0% Mydriacyl, 2.5% Phenylephrine @ 10:39 AM           Slit Lamp and Fundus Exam     External Exam       Right Left   External Normal Normal         Slit Lamp Exam       Right Left   Lids/Lashes Normal Normal   Conjunctiva/Sclera White and quiet White and quiet   Cornea Clear Clear   Anterior Chamber Deep and quiet Deep and quiet   Iris Round and reactive Round and reactive   Lens Posterior chamber intraocular lens, Open posterior capsule Posterior chamber intraocular lens, Open posterior capsule   Anterior Vitreous Normal Normal         Fundus Exam       Right Left   Posterior Vitreous Posterior vitreous detachment Posterior vitreous detachment   Disc Normal Normal   C/D Ratio 0.35 0.55   Macula Retinal pigment epithelial mottling, Soft drusen, no macular thickening,  no hemorrhage, Hard drusen Retinal pigment epithelial mottling, Soft drusen, no macular thickening, no hemorrhage   Vessels Normal Normal   Periphery  Normal Small choroidal nevus superotemporal quadrant, no subretinal fluid, no atrophy, no lipofuscin, approximately 2 disc diameters in size and flat            IMAGING AND PROCEDURES  Imaging and Procedures for 09/03/21  OCT, Retina - OU - Both Eyes       Right Eye Scan locations included subfoveal. Central Foveal Thickness: 271. Progression has been stable. Findings include abnormal foveal contour, retinal drusen , no SRF, no IRF.   Left Eye Quality was good. Scan locations included subfoveal. Central Foveal Thickness: 453. Progression has been stable. Findings include abnormal foveal contour, retinal drusen , no SRF, no IRF.   Notes No signs of CN VM OU.  OS pigmentary infiltration into the outer retinal layers stable and similar Today as compared to 1 year ago      Color Fundus Photography Optos - OU - Both Eyes       Right Eye Progression has no prior data. Macula : geographic atrophy, drusen. Vessels : normal observations.   Left Eye Progression has no prior data. Disc findings include normal observations. Macula : geographic atrophy, drusen. Vessels : normal observations. Periphery : normal observations.   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 Intermediate stage nonexudative age-related macular degeneration of both eyes  H35.3132 OCT, Retina - OU - Both Eyes    Color Fundus Photography Optos - OU - Both Eyes    2. Advanced nonexudative age-related macular degeneration of left eye with subfoveal involvement  H35.3124     3. Intermediate stage nonexudative age-related macular degeneration of right eye  H35.3112       1.  Patient to  report immediately any new onset of visual acuity decline distortion in either eye  2.  3.  Ophthalmic Meds Ordered this visit:  No orders of the defined types were placed in this encounter.      Return in about 9 months (around 06/04/2022) for DILATE OU, COLOR FP, OCT.  There are no Patient Instructions on file for this visit.   Explained the diagnoses, plan, and follow up with the patient and they expressed understanding.  Patient expressed understanding of the importance of proper follow up care.   Clent Demark Othel Hoogendoorn M.D. Diseases & Surgery of the Retina and Vitreous [facility]ina [facility] Diabetic [place] 09/03/21     Abbreviations: M myopia (nearsighted); A astigmatism; H hyperopia (farsighted); P presbyopia; Mrx spectacle prescription;  CTL contact lenses; OD right eye; OS left eye; OU both eyes  XT exotropia; ET esotropia; PEK punctate epithelial keratitis; PEE punctate epithelial erosions; DES dry eye syndrome; MGD meibomian gland dysfunction; ATs artificial tears; PFAT's preservative free artificial tears; [place] nuclear sclerotic cataract; PSC posterior subcapsular cataract; ERM epi-retinal membrane; PVD posterior vitreous detachment; RD retinal detachment; DM diabetes mellitus; DR diabetic retinopathy; NPDR non-proliferative diabetic retinopathy; PDR proliferative diabetic retinopathy; CSME clinically significant macular edema; DME diabetic macular edema; dbh dot blot hemorrhages; CWS cotton wool spot; POAG primary open angle glaucoma; C/D cup-to-disc ratio;  HVF humphrey visual field; GVF goldmann visual field; OCT optical coherence tomography; IOP intraocular pressure; BRVO Branch retinal vein occlusion; CRVO central retinal vein occlusion; CRAO central retinal artery occlusion; BRAO branch retinal artery occlusion; RT retinal tear; SB scleral buckle; PPV pars plana vitrectomy; VH Vitreous hemorrhage; PRP panretinal laser photocoagulation; IVK intravitreal kenalog; VMT vitreomacular traction; MH  Macular hole;  NVD neovascularization of the disc; NVE neovascularization elsewhere; AREDS age related eye disease study; ARMD age related macular degeneration; POAG primary open angle glaucoma; EBMD epithelial/anterior basement membrane dystrophy; ACIOL anterior chamber intraocular lens; IOL intraocular lens; PCIOL posterior chamber intraocular lens; Phaco/IOL phacoemulsification with intraocular lens placement; [place] photorefractive keratectomy; LASIK laser assisted in situ keratomileusis; HTN hypertension; DM diabetes mellitus; COPD chronic obstructive pulmonary disease

## 2021-09-03 NOTE — Assessment & Plan Note (Signed)
Subfoveal geographic atrophy accounts for acuity, no sign of CNVM

## 2022-02-12 ENCOUNTER — Telehealth: Payer: Self-pay | Admitting: Orthopaedic Surgery

## 2022-02-12 NOTE — Telephone Encounter (Signed)
Pt called requesting to see Lorin Mercy about her leg swelling. Please call pt at [phone]

## 2022-03-10 ENCOUNTER — Ambulatory Visit (INDEPENDENT_AMBULATORY_CARE_PROVIDER_SITE_OTHER): Payer: Medicare Other

## 2022-03-10 ENCOUNTER — Ambulatory Visit: Payer: Medicare Other | Admitting: Orthopaedic Surgery

## 2022-03-10 VITALS — BP 147/59 | HR 47

## 2022-03-10 DIAGNOSIS — G8929 Other chronic pain: Secondary | ICD-10-CM | POA: Diagnosis not present

## 2022-03-10 DIAGNOSIS — M1711 Unilateral primary osteoarthritis, right knee: Secondary | ICD-10-CM

## 2022-03-10 DIAGNOSIS — M25561 Pain in right knee: Secondary | ICD-10-CM

## 2022-03-10 NOTE — Progress Notes (Signed)
Office Visit Note   Patient: Colleen Mclaughlin           Date of Birth: Jul 29, 1933           MRN: [id number] Visit Date: 03/10/2022              Requested by: Donnajean Lopes, MD [address] [place],  [place] [address] PCP: Donnajean Lopes, MD   Assessment & Plan: Visit Diagnoses:  1. Chronic pain of right knee     Plan: Right knee injection performed we discussed total knee arthroplasty she states she wants to avoid surgery if she gets worse with injections not effective she can return to discuss this further if she so desires.  If she gets good relief she can return in 4 to 6 months for repeat injection.  Follow-Up Instructions: No follow-ups on file.   Orders:  Orders Placed This Encounter  Procedures   XR Knee 1-2 Views Right   No orders of the defined types were placed in this encounter.     Procedures: Large Joint Inj: R knee on 03/10/2022 12:07 PM Indications: pain and joint swelling Details: 22 G 1.5 in needle, anterolateral approach  Arthrogram: No  Medications: 40 mg methylPREDNISolone acetate 40 MG/ML; 0.5 mL lidocaine 1 %; 4 mL bupivacaine 0.25 % Outcome: tolerated well, no immediate complications Procedure, treatment alternatives, risks and benefits explained, specific risks discussed. Consent was given by the patient. Immediately prior to procedure a time out was called to verify the correct patient, procedure, equipment, support staff and site/side marked as required. Patient was prepped and draped in the usual sterile fashion.       Clinical Data: No additional findings.   Subjective: Chief Complaint  Patient presents with   Right Knee - Pain    HPI 88 year old female seen with right knee pain.  She had Durolane injection 12/17/2020 by Dr. Ninfa Linden and states that she did not get any relief.  She has mild varus deformity bone on bone medial compartment with erosive changes.  She is requesting a cortisone injection today.  Review of  Systems Past history of femoral neck fracture treated with hip arthroplasty.  Positive for macular degeneration decreased visual acuity all the systems noncontributory HPI.   Objective: Vital Signs: BP (!) 147/59   Pulse (!) 47   Physical Exam Constitutional:      Appearance: She is well-developed.  HENT:     Head: Normocephalic.     Right Ear: External ear normal.     Left Ear: External ear normal. There is no impacted cerumen.  Eyes:     Pupils: Pupils are equal, round, and reactive to light.  Neck:     Thyroid: No thyromegaly.     Trachea: No tracheal deviation.  Cardiovascular:     Rate and Rhythm: Normal rate.  Pulmonary:     Effort: Pulmonary effort is normal.  Abdominal:     Palpations: Abdomen is soft.  Musculoskeletal:     Cervical back: No rigidity.  Skin:    General: Skin is warm and dry.  Neurological:     Mental Status: She is alert and oriented to person, place, and time.  Psychiatric:        Behavior: Behavior normal.     Ortho Exam crepitus with knee range of motion mild effusion.  Collateral ligaments are stable pulses are normal negative logroll the hips.  Medial joint line tenderness.  Specialty Comments:  No specialty comments available.  Imaging:  No results found.   PMFS History: Patient Active Problem List   Diagnosis Date Noted   Intermediate stage nonexudative age-related macular degeneration of right eye 09/03/2021   Unilateral primary osteoarthritis, right knee 11/24/2020   Advanced nonexudative age-related macular degeneration of left eye with subfoveal involvement 02/27/2020   Degenerative retinal drusen of right eye 02/27/2020   Choroidal nevus of left eye 02/27/2020   Status post total replacement of left hip 09/26/2018   Fracture of femoral neck, left ([place]) 09/07/2018   Past Medical History:  Diagnosis Date   Anemia    x2 years ago took med cleared   Arthritis    general   Cancer ([place])    endometrial / complete hyst 2009    Carpal tunnel syndrome on left    Hypertension    Osteopenia     No family history on file.  Past Surgical History:  Procedure Laterality Date   CARPAL TUNNEL RELEASE Right    2012   CARPAL TUNNEL RELEASE Left 08/29/2018   Procedure: LEFT CARPAL TUNNEL RELEASE;  Surgeon: Daryll Brod, MD;  Location: [facility];  Service: Orthopedics;  Laterality: Left;   CATARACT EXTRACTION W/ INTRAOCULAR LENS  IMPLANT, BILATERAL     one approx 5 years ago and the other greater than that but unsure when   KNEE SURGERY Right    approx 25 years ago "chipped bone"   [place] HIP ARTHROPLASTY Left 09/08/2018   Procedure: TOTAL HIP ARTHROPLASTY ANTERIOR APPROACH;  Surgeon: Mcarthur Rossetti, MD;  Location: [facility];  Service: Orthopedics;  Laterality: Left;   VAGINAL HYSTERECTOMY     2009   Social History   Occupational History   Occupation: retired  Tobacco Use   Smoking status: Former    Types: Cigarettes    Quit date: 1996    Years since quitting: 27.4   Smokeless tobacco: Never  Vaping Use   Vaping Use: Never used  Substance and Sexual Activity   Alcohol use: Never   Drug use: Never   Sexual activity: Not on file

## 2022-03-11 MED ORDER — METHYLPREDNISOLONE ACETATE 40 MG/ML IJ SUSP
40.0000 mg | INTRAMUSCULAR | Status: AC | PRN
  Administered 2022-03-10: 40 mg via INTRA_ARTICULAR

## 2022-03-11 MED ORDER — LIDOCAINE HCL 1 % IJ SOLN
0.5000 mL | INTRAMUSCULAR | Status: AC | PRN
  Administered 2022-03-10: .5 mL

## 2022-03-11 MED ORDER — BUPIVACAINE HCL 0.25 % IJ SOLN
4.0000 mL | INTRAMUSCULAR | Status: AC | PRN
  Administered 2022-03-10: 4 mL via INTRA_ARTICULAR

## 2022-06-03 ENCOUNTER — Ambulatory Visit (INDEPENDENT_AMBULATORY_CARE_PROVIDER_SITE_OTHER): Payer: Medicare Other | Admitting: Ophthalmology

## 2022-06-03 DIAGNOSIS — H353112 Nonexudative age-related macular degeneration, right eye, intermediate dry stage: Secondary | ICD-10-CM | POA: Diagnosis not present

## 2022-06-03 DIAGNOSIS — H353132 Nonexudative age-related macular degeneration, bilateral, intermediate dry stage: Secondary | ICD-10-CM

## 2022-06-03 DIAGNOSIS — H353124 Nonexudative age-related macular degeneration, left eye, advanced atrophic with subfoveal involvement: Secondary | ICD-10-CM

## 2022-06-03 NOTE — Assessment & Plan Note (Signed)
No signs of active disease.  Geographic atrophy limits acuity OS.

## 2022-06-03 NOTE — Assessment & Plan Note (Signed)
No active CNVM OD

## 2022-06-03 NOTE — Progress Notes (Signed)
06/03/2022     CHIEF COMPLAINT Patient presents for  Chief Complaint  Patient presents with   Macular Degeneration      HISTORY OF PRESENT ILLNESS: Colleen Mclaughlin is a 88 y.o. female who presents to the clinic today for:   HPI   8 MOS FOR DILATE OU, COLOR FP, OCT. Pt stated, "I have some difficulties with some numbers. Some numbers look joined up together and some designs look fat or weird." Pt is currently taking latanoprost- 1 drop into the left eyes at bedtime.  Last edited by Silvestre Moment on 06/03/2022 11:09 AM.      Referring physician: Donnajean Lopes, MD [address] [place],  [place] [address]  HISTORICAL INFORMATION:   Selected notes from the [facility]: No current outpatient medications on file. (Ophthalmic Drugs)   No current facility-administered medications for this visit. (Ophthalmic Drugs)   Current Outpatient Medications (Other)  Medication Sig   alendronate (FOSAMAX) 70 MG tablet Take 70 mg by mouth once a week. Take with a full glass of water on an empty stomach.   aspirin EC 325 MG EC tablet Take 1 tablet (325 mg total) by mouth daily with breakfast.   atenolol (TENORMIN) 50 MG tablet Take 50 mg by mouth daily.   calcium carbonate (OS-CAL - DOSED IN MG OF ELEMENTAL CALCIUM) 1250 (500 Ca) MG tablet Take 2 tablets by mouth daily with breakfast.   cyclobenzaprine (FLEXERIL) 10 MG tablet Take 10 mg by mouth 3 (three) times daily as needed for muscle spasms.    levothyroxine (SYNTHROID, LEVOTHROID) 200 MCG tablet Take 200 mcg by mouth daily before breakfast.   Multiple Vitamin (MULTI-VITAMIN DAILY PO) Take 1 tablet by mouth daily.    Multiple Vitamins-Minerals (ICAPS AREDS 2 PO) Take 1 capsule by mouth daily.    omega-3 fish oil (MAXEPA) 1000 MG CAPS capsule Take 1 capsule by mouth daily.    triamterene-hydrochlorothiazide (MAXZIDE-25) 37.5-25 MG tablet Take 0.5 tablets by mouth daily.    Vitamin D, Ergocalciferol,  (DRISDOL) 1.25 MG (50000 UT) CAPS capsule Take 50,000 Units by mouth every 30 (thirty) days.    No current facility-administered medications for this visit. (Other)      REVIEW OF SYSTEMS: ROS   Negative for: Constitutional, Gastrointestinal, Neurological, Skin, Genitourinary, Musculoskeletal, HENT, Endocrine, Cardiovascular, Eyes, Respiratory, Psychiatric, Allergic/Imm, Heme/Lymph Last edited by Silvestre Moment on 06/03/2022 11:09 AM.       ALLERGIES Allergies  Allergen Reactions   Sulfa Antibiotics Anaphylaxis    Hives    Codeine Other (See Comments)    Severe headaches     PAST MEDICAL HISTORY Past Medical History:  Diagnosis Date   Anemia    x2 years ago took med cleared   Arthritis    general   Cancer ([facility])    endometrial / complete hyst 2009   Carpal tunnel syndrome on left    Hypertension    Osteopenia    Past Surgical History:  Procedure Laterality Date   CARPAL TUNNEL RELEASE Right    2012   CARPAL TUNNEL RELEASE Left 08/29/2018   Procedure: LEFT CARPAL TUNNEL RELEASE;  Surgeon: Daryll Brod, MD;  Location: [facility];  Service: Orthopedics;  Laterality: Left;   CATARACT EXTRACTION W/ INTRAOCULAR LENS  IMPLANT, BILATERAL     one approx 5 years ago and the other greater than that but unsure when   KNEE SURGERY Right    approx  25 years ago "chipped bone"   RETINAL DETACHMENT SURGERY     THYROIDECTOMY  1967   TOTAL HIP ARTHROPLASTY Left 09/08/2018   Procedure: TOTAL HIP ARTHROPLASTY ANTERIOR APPROACH;  Surgeon: Mcarthur Rossetti, MD;  Location: [facility];  Service: Orthopedics;  Laterality: Left;   VAGINAL HYSTERECTOMY     2009    FAMILY HISTORY No family history on file.  SOCIAL HISTORY Social History   Tobacco Use   Smoking status: Former    Types: Cigarettes    Quit date: 1996    Years since quitting: 27.7   Smokeless tobacco: Never  Vaping Use   Vaping Use: Never used  Substance Use Topics   Alcohol use: Never   Drug use:  Never         OPHTHALMIC EXAM:  Base Eye Exam     Visual Acuity (ETDRS)       Right Left   Dist [place] 20/40 -2 20/150   Dist ph [place] 20/30 -1 20/100 -2         Tonometry (Tonopen, 11:15 AM)       Right Left   Pressure 18 16         Pupils       Pupils APD   Right PERRL None   Left PERRL None         Visual Fields       Left Right    Full Full         Extraocular Movement       Right Left    Full, Ortho Full, Ortho         Neuro/Psych     Oriented x3: Yes   Mood/Affect: Normal         Dilation     Both eyes: 1.0% Mydriacyl, 2.5% Phenylephrine @ 11:15 AM           Slit Lamp and Fundus Exam     External Exam       Right Left   External Normal Normal         Slit Lamp Exam       Right Left   Lids/Lashes Normal Normal   Conjunctiva/Sclera White and quiet White and quiet   Cornea Clear Clear   Anterior Chamber Deep and quiet Deep and quiet   Iris Round and reactive Round and reactive   Lens Posterior chamber intraocular lens, Open posterior capsule Posterior chamber intraocular lens, Open posterior capsule   Anterior Vitreous Normal Normal         Fundus Exam       Right Left   Posterior Vitreous Posterior vitreous detachment Posterior vitreous detachment   Disc Normal Normal   C/D Ratio 0.35 0.55   Macula Retinal pigment epithelial mottling, Soft drusen, no macular thickening, no hemorrhage, Hard drusen Retinal pigment epithelial mottling, Soft drusen, no macular thickening, no hemorrhage   Vessels Normal Normal   Periphery Normal Small choroidal nevus superotemporal quadrant, no subretinal fluid, no atrophy, no lipofuscin, approximately 2 disc diameters in size and flat            IMAGING AND PROCEDURES  Imaging and Procedures for 06/03/22  OCT, Retina - OU - Both Eyes       Right Eye Scan locations included subfoveal. Central Foveal Thickness: 277. Progression has been stable. Findings include no IRF, no SRF,  abnormal foveal contour, retinal drusen .   Left Eye Quality was good. Scan locations included subfoveal. Central Foveal Thickness: 368.  Progression has been stable. Findings include no IRF, no SRF, abnormal foveal contour, retinal drusen .   Notes No signs of CN VM OU.  OS pigmentary infiltration into the outer retinal layers stable and similar Today as compared to 1 year ago       Color Fundus Photography Optos - OU - Both Eyes       Right Eye Progression has no prior data. Macula : drusen, geographic atrophy. Vessels : normal observations.   Left Eye Progression has no prior data. Disc findings include normal observations. Macula : drusen, geographic atrophy. Vessels : normal observations. Periphery : normal observations.   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  Geographic atrophy limits acuity OS.  Intermediate stage nonexudative age-related macular degeneration of right eye No active CNVM OD     ICD-10-CM   1. Intermediate stage nonexudative age-related macular degeneration of both eyes  H35.3132 OCT, Retina - OU - Both Eyes    Color Fundus Photography Optos - OU - Both Eyes    2. Advanced nonexudative age-related macular degeneration of left eye with subfoveal involvement  H35.3124     3. Intermediate stage nonexudative age-related macular degeneration of right eye  H35.3112       1.  OU overall stable.  No signs of active CNVM recurrence OS.  We will continue to monitor and observe  2.  Patient to report promptly new onset visual acuity decline or disturbance or distortions particularly right eye  3.  Ophthalmic Meds Ordered this visit:  No orders of the defined types were placed in this encounter.      Return in about 9 months (around 03/04/2023) for DILATE OU, COLOR FP.  There are no Patient Instructions on  file for this visit.   Explained the diagnoses, plan, and follow up with the patient and they expressed understanding.  Patient expressed understanding of the importance of proper follow up care.   Clent Demark Tirth Cothron M.D. Diseases & Surgery of the Retina and Vitreous [facility] 06/03/22     Abbreviations: M myopia (nearsighted); A astigmatism; H hyperopia (farsighted); P presbyopia; Mrx spectacle prescription;  CTL contact lenses; OD right eye; OS left eye; OU both eyes  XT exotropia; ET esotropia; PEK punctate epithelial keratitis; PEE punctate epithelial erosions; DES dry eye syndrome; MGD meibomian gland dysfunction; ATs artificial tears; PFAT's preservative free artificial tears; [place] nuclear sclerotic cataract; PSC posterior subcapsular cataract; ERM epi-retinal membrane; PVD posterior vitreous detachment; RD retinal detachment; DM diabetes mellitus; DR diabetic retinopathy; NPDR non-proliferative diabetic retinopathy; PDR proliferative diabetic retinopathy; CSME clinically significant macular edema; DME diabetic macular edema; dbh dot blot hemorrhages; CWS cotton wool spot; POAG primary open angle glaucoma; C/D cup-to-disc ratio; HVF humphrey visual field; GVF goldmann visual field; OCT optical coherence tomography; IOP intraocular pressure; BRVO Branch retinal vein occlusion; CRVO central retinal vein occlusion; CRAO central retinal artery occlusion; BRAO branch retinal artery occlusion; RT retinal tear; SB scleral buckle; PPV pars plana vitrectomy; VH Vitreous hemorrhage; PRP panretinal laser photocoagulation; IVK intravitreal kenalog; VMT vitreomacular traction; MH Macular hole;  NVD neovascularization of the disc; NVE neovascularization elsewhere; AREDS age related eye disease study; ARMD age related macular degeneration; POAG primary open angle glaucoma; EBMD epithelial/anterior basement membrane dystrophy; ACIOL anterior chamber intraocular lens; IOL intraocular lens; PCIOL  posterior  chamber intraocular lens; Phaco/IOL phacoemulsification with intraocular lens placement; [place]eb photorefractive keratectomy; LASIK laser assisted in situ keratomileusis; HTN hypertension; DM diabetes mellitus; COPD chronic obstructive pulmonary disease

## 2023-03-07 ENCOUNTER — Encounter (INDEPENDENT_AMBULATORY_CARE_PROVIDER_SITE_OTHER): Payer: Medicare Other | Admitting: Ophthalmology
